# Patient Record
Sex: Male | Born: 1965
Health system: Southern US, Community
[De-identification: ages and names within clinical notes are randomized; demographics above are authoritative.]

## PROBLEM LIST (undated history)

## (undated) DIAGNOSIS — D649 Anemia, unspecified: Secondary | ICD-10-CM

## (undated) DIAGNOSIS — G4730 Sleep apnea, unspecified: Secondary | ICD-10-CM

## (undated) DIAGNOSIS — I10 Essential (primary) hypertension: Secondary | ICD-10-CM

## (undated) DIAGNOSIS — R011 Cardiac murmur, unspecified: Secondary | ICD-10-CM

## (undated) DIAGNOSIS — M1990 Unspecified osteoarthritis, unspecified site: Secondary | ICD-10-CM

## (undated) DIAGNOSIS — I509 Heart failure, unspecified: Secondary | ICD-10-CM

## (undated) HISTORY — DX: Cardiac murmur, unspecified: R01.1

## (undated) HISTORY — DX: Unspecified osteoarthritis, unspecified site: M19.90

## (undated) HISTORY — DX: Heart failure, unspecified: I50.9

---

## 2000-12-18 HISTORY — PX: CHOLECYSTECTOMY: SHX55

## 2005-01-15 ENCOUNTER — Emergency Department: Payer: Self-pay | Admitting: Emergency Medicine

## 2005-08-05 ENCOUNTER — Emergency Department: Payer: Self-pay | Admitting: Unknown Physician Specialty

## 2006-09-05 ENCOUNTER — Emergency Department (HOSPITAL_COMMUNITY): Admission: EM | Admit: 2006-09-05 | Discharge: 2006-09-05 | Payer: Self-pay | Admitting: Emergency Medicine

## 2006-10-31 ENCOUNTER — Emergency Department: Payer: Self-pay | Admitting: Emergency Medicine

## 2006-12-10 ENCOUNTER — Emergency Department: Payer: Self-pay | Admitting: Internal Medicine

## 2008-12-18 HISTORY — PX: CORNEAL TRANSPLANT: SHX108

## 2010-01-23 ENCOUNTER — Emergency Department: Payer: Self-pay | Admitting: Internal Medicine

## 2010-03-18 ENCOUNTER — Ambulatory Visit: Payer: Self-pay | Admitting: Internal Medicine

## 2010-03-25 ENCOUNTER — Inpatient Hospital Stay: Payer: Self-pay | Admitting: Internal Medicine

## 2010-04-17 ENCOUNTER — Ambulatory Visit: Payer: Self-pay | Admitting: Internal Medicine

## 2011-02-16 IMAGING — CR DG CHEST 1V PORT
1 series · 1 of 1 positions shown · non-contrast
Comparison: none

REASON FOR EXAM: vent
COMMENTS:

[view not recorded]
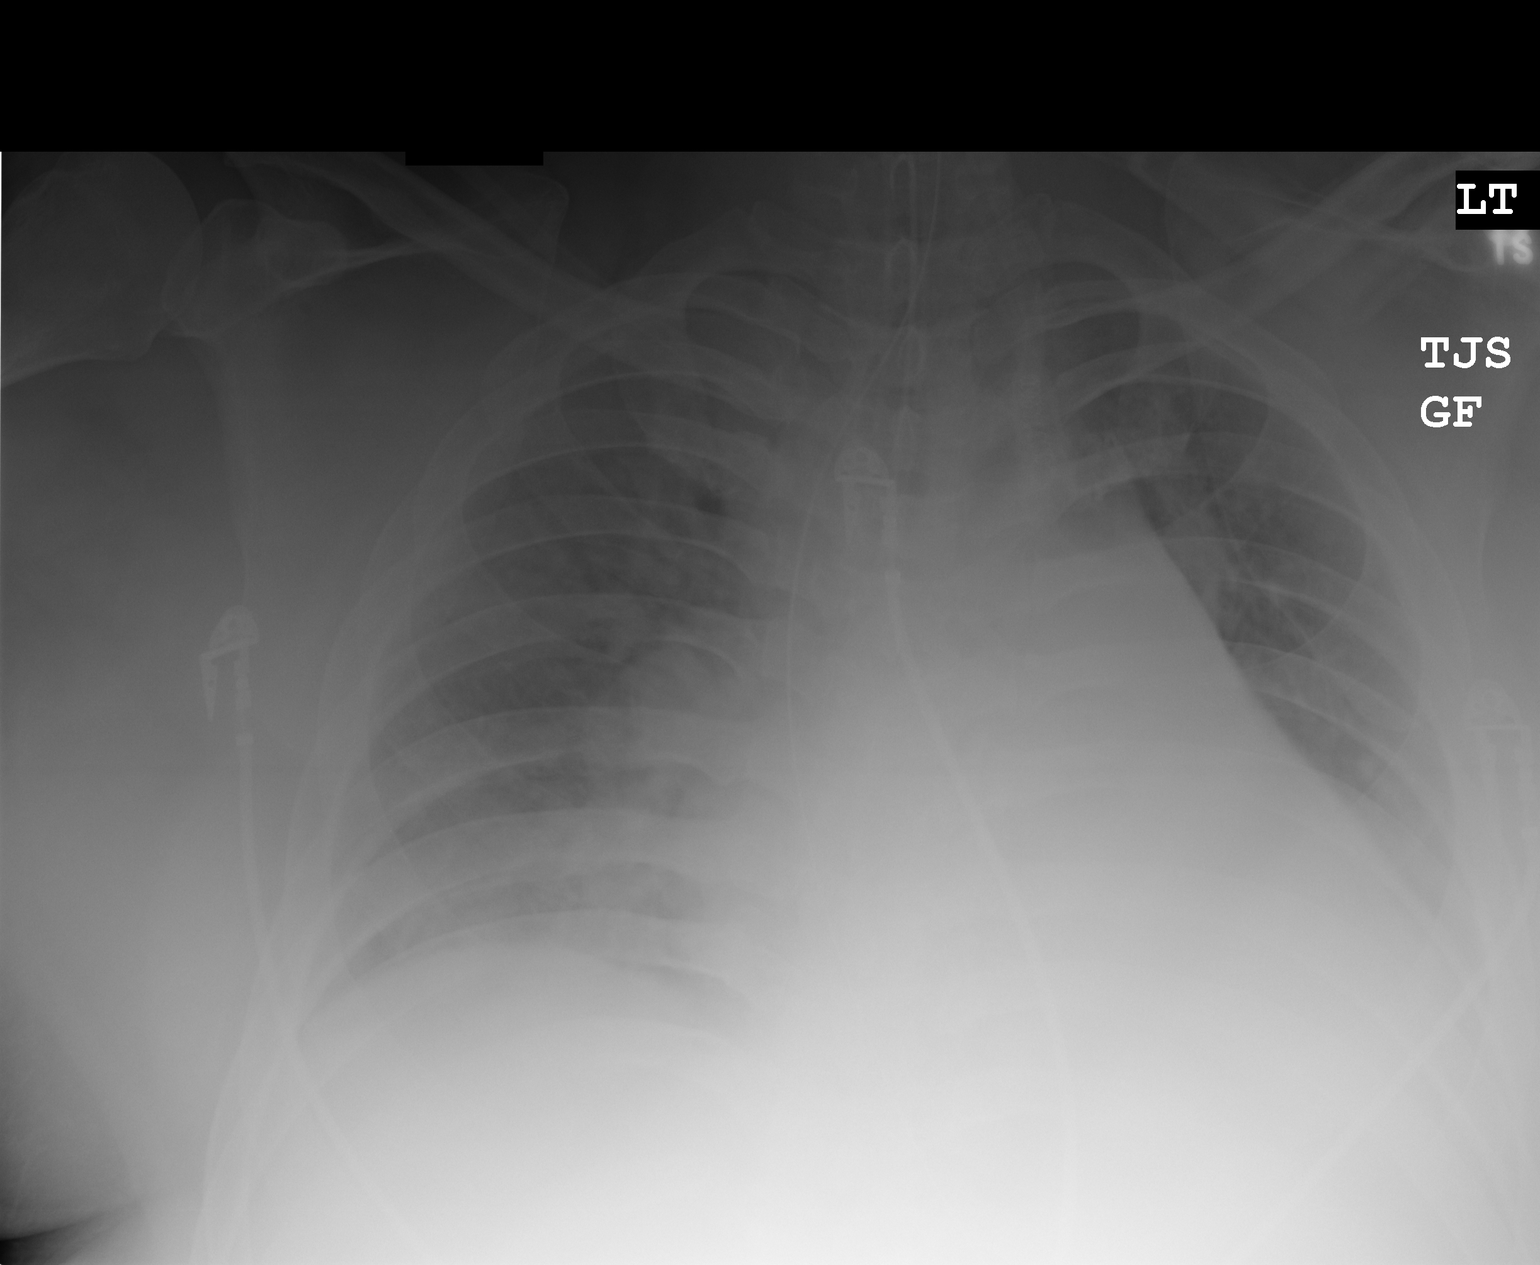

[1 of 1 positions shown; findings below may reference images not displayed]

PROCEDURE:     DXR - DXR PORTABLE CHEST SINGLE VIEW  - March 29, 2010  [DATE]

RESULT:      Comparison is made to a prior study dated 03/28/10. This study
is underexposed. With technique taken into consideration, there is
prominence of the interstitial markings. The cardiac silhouette is enlarged
indicative of cardiomegaly.  An area of consolidative density projects in
the left lung base. Endotracheal tube is appreciated with the tip projecting
approximately 1.0 cm above the carina. Retraction of approximately 1.5 cm is
recommended. NG tube is seen with the tip not on the view of this study. The
visualized bony skeleton is unremarkable.
IMPRESSION: 1. Interstitial infiltrate, edematous versus nonedematous.
2. Atelectasis versus asymmetric edema versus infiltrate left lung base.
3. Support lines and tubes as described above. Retraction of the
endotracheal tube by approximately 1.5 cm is recommended. Continued
surveillance evaluation recommended.

## 2011-05-31 ENCOUNTER — Emergency Department: Payer: Self-pay | Admitting: Emergency Medicine

## 2012-10-07 ENCOUNTER — Emergency Department: Payer: Self-pay | Admitting: Emergency Medicine

## 2012-10-07 LAB — BASIC METABOLIC PANEL
Calcium, Total: 9.1 mg/dL (ref 8.5–10.1)
EGFR (Non-African Amer.): 60 — ABNORMAL LOW
Glucose: 107 mg/dL — ABNORMAL HIGH (ref 65–99)
Potassium: 3.7 mmol/L (ref 3.5–5.1)
Sodium: 140 mmol/L (ref 136–145)

## 2012-10-07 LAB — CBC
MCH: 22.5 pg — ABNORMAL LOW (ref 26.0–34.0)
MCHC: 35.8 g/dL (ref 32.0–36.0)
MCV: 63 fL — ABNORMAL LOW (ref 80–100)
Platelet: 110 10*3/uL — ABNORMAL LOW (ref 150–440)

## 2012-10-07 LAB — TROPONIN I: Troponin-I: <0.02 ng/mL

## 2012-10-13 LAB — CULTURE, BLOOD (SINGLE)

## 2012-11-29 ENCOUNTER — Emergency Department: Payer: Self-pay | Admitting: Emergency Medicine

## 2014-04-18 ENCOUNTER — Emergency Department: Payer: Self-pay | Admitting: Emergency Medicine

## 2014-04-18 LAB — CBC
HCT: 35.6 % — ABNORMAL LOW (ref 40.0–52.0)
HGB: 12.2 g/dL — AB (ref 13.0–18.0)
MCH: 22.7 pg — ABNORMAL LOW (ref 26.0–34.0)
MCHC: 34.4 g/dL (ref 32.0–36.0)
MCV: 66 fL — ABNORMAL LOW (ref 80–100)
Platelet: 106 10*3/uL — ABNORMAL LOW (ref 150–440)
RBC: 5.38 10*6/uL (ref 4.40–5.90)
RDW: 19.9 % — AB (ref 11.5–14.5)
WBC: 9.5 10*3/uL (ref 3.8–10.6)

## 2014-04-18 LAB — TROPONIN I

## 2014-04-18 LAB — BASIC METABOLIC PANEL
Anion Gap: 9 (ref 7–16)
BUN: 20 mg/dL — ABNORMAL HIGH (ref 7–18)
CO2: 27 mmol/L (ref 21–32)
Calcium, Total: 8.7 mg/dL (ref 8.5–10.1)
Chloride: 106 mmol/L (ref 98–107)
Creatinine: 1.41 mg/dL — ABNORMAL HIGH (ref 0.60–1.30)
EGFR (Non-African Amer.): 58 — ABNORMAL LOW
Glucose: 103 mg/dL — ABNORMAL HIGH (ref 65–99)
OSMOLALITY: 286 (ref 275–301)
Potassium: 3.6 mmol/L (ref 3.5–5.1)
SODIUM: 142 mmol/L (ref 136–145)

## 2014-04-18 LAB — PRO B NATRIURETIC PEPTIDE: B-TYPE NATIURETIC PEPTID: 210 pg/mL — AB (ref 0–125)

## 2014-11-05 LAB — CBC AND DIFFERENTIAL
HEMATOCRIT: 35 % — AB (ref 41–53)
Hemoglobin: 12.3 g/dL — AB (ref 13.5–17.5)
PLATELETS: 121 10*3/uL — AB (ref 150–399)
WBC: 10.8 10^3/mL

## 2014-11-05 LAB — TSH: TSH: 2.28 u[IU]/mL (ref <=5.90)

## 2014-11-05 LAB — BASIC METABOLIC PANEL
BUN: 24 mg/dL — AB (ref 4–21)
CREATININE: 1.4 mg/dL — AB (ref <=1.3)

## 2014-11-05 LAB — LIPID PANEL
Cholesterol: 188 mg/dL (ref 0–200)
HDL: 44 mg/dL (ref 35–70)
LDL CALC: 125 mg/dL
TRIGLYCERIDES: 95 mg/dL (ref 40–160)

## 2014-11-05 LAB — PSA: PSA: 0.5

## 2014-11-05 LAB — HEPATIC FUNCTION PANEL: Bilirubin, Total: 1.7 mg/dL

## 2015-07-23 ENCOUNTER — Other Ambulatory Visit: Payer: Self-pay

## 2015-07-23 DIAGNOSIS — I10 Essential (primary) hypertension: Secondary | ICD-10-CM

## 2015-07-26 ENCOUNTER — Other Ambulatory Visit: Payer: Self-pay

## 2015-07-27 ENCOUNTER — Other Ambulatory Visit: Payer: Self-pay

## 2015-07-27 DIAGNOSIS — I10 Essential (primary) hypertension: Secondary | ICD-10-CM | POA: Insufficient documentation

## 2015-07-27 DIAGNOSIS — Z125 Encounter for screening for malignant neoplasm of prostate: Secondary | ICD-10-CM | POA: Insufficient documentation

## 2015-07-27 MED ORDER — LOSARTAN POTASSIUM 100 MG PO TABS: 100.0000 mg | ORAL_TABLET | Freq: Every day | ORAL | Status: DC

## 2015-07-27 MED ORDER — FUROSEMIDE 20 MG PO TABS: 20.0000 mg | ORAL_TABLET | Freq: Every day | ORAL | Status: DC

## 2015-07-27 MED ORDER — CARVEDILOL 25 MG PO TABS: 25.0000 mg | ORAL_TABLET | Freq: Two times a day (BID) | ORAL | Status: DC

## 2015-07-30 ENCOUNTER — Ambulatory Visit: Payer: Self-pay | Admitting: Internal Medicine

## 2015-08-04 ENCOUNTER — Ambulatory Visit: Payer: Self-pay | Admitting: Internal Medicine

## 2015-08-04 ENCOUNTER — Encounter: Payer: Self-pay | Admitting: Internal Medicine

## 2015-08-04 DIAGNOSIS — G4730 Sleep apnea, unspecified: Secondary | ICD-10-CM

## 2015-08-24 ENCOUNTER — Other Ambulatory Visit: Payer: Self-pay | Admitting: Internal Medicine

## 2015-08-24 ENCOUNTER — Ambulatory Visit: Payer: Self-pay | Admitting: Internal Medicine

## 2015-08-24 MED ORDER — LOSARTAN POTASSIUM 100 MG PO TABS: 100.0000 mg | ORAL_TABLET | Freq: Every day | ORAL | Status: DC

## 2015-08-24 MED ORDER — CARVEDILOL 25 MG PO TABS: 25.0000 mg | ORAL_TABLET | Freq: Two times a day (BID) | ORAL | Status: DC

## 2015-08-24 MED ORDER — FUROSEMIDE 20 MG PO TABS: 20.0000 mg | ORAL_TABLET | Freq: Every day | ORAL | Status: DC

## 2015-11-04 ENCOUNTER — Other Ambulatory Visit: Payer: Self-pay | Admitting: Internal Medicine

## 2015-11-04 NOTE — Telephone Encounter (Signed)
No further refills until patient seen.

## 2015-11-16 NOTE — Telephone Encounter (Signed)
Lm several messages and no return calls

## 2015-12-08 ENCOUNTER — Emergency Department
Admission: EM | Admit: 2015-12-08 | Discharge: 2015-12-09 | Disposition: A | Discharge status: Other Acute Inpatient Hospital | Payer: Self-pay | Attending: Emergency Medicine | Admitting: Emergency Medicine

## 2015-12-08 ENCOUNTER — Emergency Department: Payer: Self-pay

## 2015-12-08 ENCOUNTER — Encounter: Payer: Self-pay | Admitting: Emergency Medicine

## 2015-12-08 DIAGNOSIS — E669 Obesity, unspecified: Secondary | ICD-10-CM | POA: Insufficient documentation

## 2015-12-08 DIAGNOSIS — R400 Somnolence: Secondary | ICD-10-CM | POA: Insufficient documentation

## 2015-12-08 DIAGNOSIS — I509 Heart failure, unspecified: Secondary | ICD-10-CM | POA: Insufficient documentation

## 2015-12-08 DIAGNOSIS — I159 Secondary hypertension, unspecified: Secondary | ICD-10-CM | POA: Insufficient documentation

## 2015-12-08 DIAGNOSIS — Z79899 Other long term (current) drug therapy: Secondary | ICD-10-CM | POA: Insufficient documentation

## 2015-12-08 DIAGNOSIS — J159 Unspecified bacterial pneumonia: Secondary | ICD-10-CM | POA: Insufficient documentation

## 2015-12-08 DIAGNOSIS — K920 Hematemesis: Secondary | ICD-10-CM | POA: Insufficient documentation

## 2015-12-08 DIAGNOSIS — J189 Pneumonia, unspecified organism: Secondary | ICD-10-CM

## 2015-12-08 DIAGNOSIS — A419 Sepsis, unspecified organism: Secondary | ICD-10-CM | POA: Insufficient documentation

## 2015-12-08 DIAGNOSIS — R509 Fever, unspecified: Secondary | ICD-10-CM

## 2015-12-08 HISTORY — DX: Essential (primary) hypertension: I10

## 2015-12-08 LAB — COMPREHENSIVE METABOLIC PANEL
ALBUMIN: 4.6 g/dL (ref 3.5–5.0)
ALK PHOS: 75 U/L (ref 38–126)
ALT: 20 U/L (ref 17–63)
ANION GAP: 10 (ref 5–15)
AST: 21 U/L (ref 15–41)
BUN: 19 mg/dL (ref 6–20)
CALCIUM: 9.1 mg/dL (ref 8.9–10.3)
CO2: 28 mmol/L (ref 22–32)
CREATININE: 1.5 mg/dL — AB (ref 0.61–1.24)
Chloride: 102 mmol/L (ref 101–111)
GFR calc Af Amer: >60 mL/min (ref >60)
GFR calc non Af Amer: 52 mL/min — ABNORMAL LOW (ref >60)
GLUCOSE: 187 mg/dL — AB (ref 65–99)
Potassium: 3.9 mmol/L (ref 3.5–5.1)
SODIUM: 140 mmol/L (ref 135–145)
Total Bilirubin: 1.6 mg/dL — ABNORMAL HIGH (ref 0.3–1.2)
Total Protein: 7.6 g/dL (ref 6.5–8.1)

## 2015-12-08 LAB — CBC
HCT: 36 % — ABNORMAL LOW (ref 40.0–52.0)
HEMOGLOBIN: 12.2 g/dL — AB (ref 13.0–18.0)
MCH: 21.8 pg — AB (ref 26.0–34.0)
MCHC: 33.8 g/dL (ref 32.0–36.0)
MCV: 64.7 fL — ABNORMAL LOW (ref 80.0–100.0)
Platelets: 121 10*3/uL — ABNORMAL LOW (ref 150–440)
RBC: 5.57 MIL/uL (ref 4.40–5.90)
RDW: 22.4 % — ABNORMAL HIGH (ref 11.5–14.5)
WBC: 25 10*3/uL — ABNORMAL HIGH (ref 3.8–10.6)

## 2015-12-08 LAB — LIPASE, BLOOD: Lipase: 23 U/L (ref 11–51)

## 2015-12-08 MED ORDER — DEXTROSE 5 % IV SOLN
500.0000 mg | Freq: Once | INTRAVENOUS | Status: AC
  Administered 2015-12-09: 500 mg via INTRAVENOUS
  Filled 2015-12-08: qty 500

## 2015-12-08 MED ORDER — CEFTRIAXONE SODIUM 1 G IJ SOLR
1.0000 g | INTRAMUSCULAR | Status: AC
  Administered 2015-12-08: 1 g via INTRAVENOUS

## 2015-12-08 MED ORDER — ONDANSETRON HCL 4 MG/2ML IJ SOLN
INTRAMUSCULAR | Status: AC
  Administered 2015-12-08: 4 mg via INTRAVENOUS
  Filled 2015-12-08: qty 2

## 2015-12-08 MED ORDER — SODIUM CHLORIDE 0.9 % IV BOLUS (SEPSIS): 1000.0000 mL | INTRAVENOUS | Status: AC

## 2015-12-08 MED ORDER — PROCHLORPERAZINE MALEATE 10 MG PO TABS
10.0000 mg | ORAL_TABLET | Freq: Once | ORAL | Status: DC
  Filled 2015-12-08: qty 1

## 2015-12-08 MED ORDER — ACETAMINOPHEN 650 MG RE SUPP
RECTAL | Status: AC
  Administered 2015-12-08: 975 mg
  Filled 2015-12-08: qty 1

## 2015-12-08 MED ORDER — SODIUM CHLORIDE 0.9 % IV BOLUS (SEPSIS)
1000.0000 mL | INTRAVENOUS | Status: DC
  Administered 2015-12-09: 1000 mL via INTRAVENOUS

## 2015-12-08 MED ORDER — SODIUM CHLORIDE 0.9 % IV BOLUS (SEPSIS)
1000.0000 mL | Freq: Once | INTRAVENOUS | Status: AC
  Administered 2015-12-08: 1000 mL via INTRAVENOUS

## 2015-12-08 MED ORDER — DEXTROSE 5 % IV SOLN
1.0000 g | Freq: Once | INTRAVENOUS | Status: DC
  Filled 2015-12-08: qty 10

## 2015-12-08 NOTE — ED Notes (Addendum)
ems pt from home states started having a headache this morning no history of migraines took daughters migraine meds with no relief. ems reports elevated BP and Vomiting upon arrival to ER. pt reports pain 10/10 in head was given 4mg  zofran IV by ems PTA and 2 inches of NTG paste chest wall

## 2015-12-08 NOTE — Progress Notes (Signed)
ANTIBIOTIC CONSULT NOTE - INITIAL  Pharmacy Consult for azithromycin/ceftriaxone Indication: pneumonia  No Known Allergies  Patient Measurements: Height: 6\' 1"  (185.4 cm) Weight: (!) 352 lb (159.666 kg) IBW/kg (Calculated) : 79.9 Adjusted Body Weight:   Vital Signs: Temp: 102.4 F (39.1 C) (12/21 2324) Temp Source: Rectal (12/21 2324) BP: 183/70 mmHg (12/21 2215) Pulse Rate: 71 (12/21 2215) Intake/Output from previous day:   Intake/Output from this shift:    Labs:  Recent Labs  12/08/15 2234  WBC 25.0*  HGB 12.2*  PLT 121*  CREATININE 1.50*   Estimated Creatinine Clearance: 92.1 mL/min (by C-G formula based on Cr of 1.5). No results for input(s): VANCOTROUGH, VANCOPEAK, VANCORANDOM, GENTTROUGH, GENTPEAK, GENTRANDOM, TOBRATROUGH, TOBRAPEAK, TOBRARND, AMIKACINPEAK, AMIKACINTROU, AMIKACIN in the last 72 hours.   Microbiology: No results found for this or any previous visit (from the past 720 hour(s)).  Medical History: Past Medical History  Diagnosis Date  . Hypertension     Medications:  Infusions:  . azithromycin    . [START ON 12/09/2015] cefTRIAXone (ROCEPHIN)  IV    . sodium chloride    . sodium chloride     And  . sodium chloride     Assessment: [name], pain 10/10. WBC 25, [name], temp 102.4, [name], [name], starting azithromycin and ceftriaxone for CAP.   Goal of Therapy:    Plan:  Expected duration 7 days with resolution of temperature and/or normalization of WBC. Azithromycin 500 mg IV Q24H and ceftriaxone 2 gm IV Q24H. Pharmacy will continue to follow.  Bradley FrostNathan A Jhordan Love, Pharm.D., BCPS Clinical Pharmacist 12/08/2015,11:48 PM

## 2015-12-08 NOTE — ED Notes (Signed)
MD at bedside. 

## 2015-12-08 NOTE — ED Provider Notes (Signed)
[facility] Emergency Department Provider Note  ____________________________________________  Time seen: Approximately 11:27 PM  I have reviewed the triage vital signs and the nursing notes.   HISTORY  Chief Complaint Headache  History limited by decreased level of consciousness  HPI Bradley Love is a 49 y.o. male who presents to the ED from home via EMS with a chief complaint of cough, vomiting blood, headache, elevated blood pressure.Daughter states patient has been having a cough productive of yellow sputum for the past several days. States this evening patient began to vomit blood and complained of headache along with elevated blood pressure. Patient took a family member's  sumatriptan without relief of headache. Denies recent travel or trauma. States patient has not been feeling well today with generalized malaise. Rest of history is limited secondary to patient's decreased level of consciousness.   Past Medical History  Diagnosis Date  . Hypertension     Patient Active Problem List   Diagnosis Date Noted  . Essential (primary) hypertension 07/27/2015  . Apnea, sleep 07/27/2015  . Encounter for screening for malignant neoplasm of prostate 07/27/2015    Past Surgical History  Procedure Laterality Date  . Cholecystectomy  2002  . Corneal transplant Right 2010    Current Outpatient Rx  Name  Route  Sig  Dispense  Refill  . carvedilol (COREG) 25 MG tablet      TAKE 1 TABLET TWICE A DAY   180 tablet   0   . furosemide (LASIX) 20 MG tablet      TAKE 1 TABLET DAILY AT 2:00 P.M.   90 tablet   0   . losartan (COZAAR) 100 MG tablet      TAKE 1 TABLET DAILY AT 2:00 P.M.   90 tablet   0     Allergies Review of patient's allergies indicates no known allergies.  Family History  Problem Relation Age of Onset  . Diabetes Mother   . Diabetes Brother   . Lung cancer Sister     Social History Social History  Substance Use  Topics  . Smoking status: Never Smoker   . Smokeless tobacco: None  . Alcohol Use: 2.4 oz/week    4 Standard drinks or equivalent per week    Review of Systems Constitutional: No fever/chills Eyes: No visual changes. ENT: No sore throat. Cardiovascular: Denies chest pain. Respiratory: Positive for productive cough. Denies shortness of breath. Gastrointestinal: No abdominal pain.  Positive for vomiting blood.  No diarrhea.  No constipation. Genitourinary: Negative for dysuria. Musculoskeletal: Negative for back pain. Skin: Negative for rash. Neurological: Positive for headache. Negative for focal weakness or numbness.  10-point ROS otherwise negative.  ____________________________________________   PHYSICAL EXAM:  VITAL SIGNS: ED Triage Vitals  Enc Vitals Group     BP 12/08/15 2215 183/70 mmHg     Pulse Rate 12/08/15 2215 71     Resp 12/08/15 2215 25     Temp 12/08/15 2215 99.5 F (37.5 C)     Temp Source 12/08/15 2215 Oral     SpO2 12/08/15 2215 88 %     Weight 12/08/15 2215 352 lb (159.666 kg)     Height 12/08/15 2215  (1.854 m)     Head Cir --      Peak Flow --      Pain Score 12/08/15 2216 10     Pain Loc --      Pain Edu? --      Excl. in GC? --  Constitutional: Somnolent but arousable. Eyes: Conjunctivae are normal. PERRL. EOMI. Head: Atraumatic. Nose: No congestion/rhinnorhea. Mouth/Throat: Mucous membranes are moist.  Oropharynx non-erythematous. Neck: No stridor.  No carotid bruits. Cardiovascular: Normal rate, regular rhythm. Grossly normal heart sounds.  Good peripheral circulation. Respiratory: Increased respiratory effort.  No retractions. Lungs diminished bibasilarly; rales and rhonchi greater in left lung. Gastrointestinal: Obese. Soft and nontender. No distention. No abdominal bruits. No CVA tenderness. Musculoskeletal: No lower extremity tenderness nor edema.  No joint effusions. Neurologic:  Somnolent but arousable. Moving all  extremities 4. Skin:  Skin is hot, dry and intact. No rash noted. Specifically, no petechiae. Psychiatric: Mood and affect are normal.   ____________________________________________   LABS (all labs ordered are listed, but only abnormal results are displayed)  Labs Reviewed  COMPREHENSIVE METABOLIC PANEL - Abnormal; Notable for the following:    Glucose, Bld 187 (*)    Creatinine, Ser 1.50 (*)    Total Bilirubin 1.6 (*)    GFR calc non Af Amer 52 (*)    All other components within normal limits  CBC - Abnormal; Notable for the following:    WBC 25.0 (*)    Hemoglobin 12.2 (*)    HCT 36.0 (*)    MCV 64.7 (*)    MCH 21.8 (*)    RDW 22.4 (*)    Platelets 121 (*)    All other components within normal limits  URINALYSIS COMPLETEWITH MICROSCOPIC ([facility] ONLY) - Abnormal; Notable for the following:    Color, Urine YELLOW (*)    APPearance CLEAR (*)    Glucose, UA 50 (*)    Hgb urine dipstick 1+ (*)    Protein, ur >500 (*)    All other components within normal limits  LACTIC ACID, PLASMA - Abnormal; Notable for the following:    Lactic Acid, Venous 2.1 (*)    All other components within normal limits  BRAIN NATRIURETIC PEPTIDE - Abnormal; Notable for the following:    B Natriuretic Peptide 161.0 (*)    All other components within normal limits  BLOOD GAS, ARTERIAL - Abnormal; Notable for the following:    pH, Arterial 7.24 (*)    pCO2 arterial 64 (*)    Allens test (pass/fail) POSITIVE (*)    All other components within normal limits  PROTIME-INR - Abnormal; Notable for the following:    Prothrombin Time 16.1 (*)    All other components within normal limits  CULTURE, BLOOD (ROUTINE X 2)  CULTURE, BLOOD (ROUTINE X 2)  URINE CULTURE  LIPASE, BLOOD  TROPONIN I  LACTIC ACID, PLASMA  TYPE AND SCREEN  ABO/RH   ____________________________________________  EKG  ED ECG REPORT I, SUNG,JADE J, the attending physician, personally viewed and interpreted this ECG.   Date:  12/08/2015  EKG Time: 2235  Rate: 96  Rhythm: normal EKG, normal sinus rhythm  Axis: Normal  Intervals:none  ST&T Change: Nonspecific  ____________________________________________  RADIOLOGY  CT head without contrast interpreted per Dr. Phill MyronMcClintock: 1. No acute intracranial process. 2. Mild paranasal sinus disease as above.  Portable chest x-ray (viewed by me, interpreted per Dr. Gwenyth Benderadparvar): Diffuse left lung airspace opacity with prominence of the upper mediastinum and apparent narrowing of the trachea. Repeat radiograph with better patient positioning or CT is recommended for further evaluation. ____________________________________________   PROCEDURES  Procedure(s) performed:   INTUBATION Performed by: Irean HongSUNG,JADE J  Required items: required blood products, implants, devices, and special equipment available Patient identity confirmed: provided demographic data and hospital-assigned identification number  Time out: Immediately prior to procedure a "time out" was called to verify the correct patient, procedure, equipment, support staff and site/side marked as required.  Indications: Airway protection  Intubation method: Glidescope Laryngoscopy   Preoxygenation: BVM  Sedatives:  Etomidate Paralytic:  Succinylcholine  Tube Size: 7.5 cuffed  Post-procedure assessment: chest rise and ETCO2 monitor Breath sounds: equal and absent over the epigastrium Tube secured with: ETT holder Chest x-ray interpreted by radiologist and me.  Portable chest x-ray (viewed by me, interpreted per Dr. Grace Isaac):  1. New endotracheal tube is in good position. 2. Improved inflation but persistent asymmetric left lung opacity favoring pneumonia or aspiration. 3. Cardiopericardial enlargement which has developed since 2015. There could be pericardial effusion.  Chest x-ray findings: endotracheal tube in appropriate position  Patient tolerated the procedure well with no immediate  complications.    Critical Care performed:   CRITICAL CARE Performed by: Irean Hong   Total critical care time: 60 minutes  Critical care time was exclusive of separately billable procedures and treating other patients.  Critical care was necessary to treat or prevent imminent or life-threatening deterioration.  Critical care was time spent personally by me on the following activities: development of treatment plan with patient and/or surrogate as well as nursing, discussions with consultants, evaluation of patient's response to treatment, examination of patient, obtaining history from patient or surrogate, ordering and performing treatments and interventions, ordering and review of laboratory studies, ordering and review of radiographic studies, pulse oximetry and re-evaluation of patient's condition.  ____________________________________________   INITIAL IMPRESSION / ASSESSMENT AND PLAN / ED COURSE  Pertinent labs & imaging results that were available during my care of the patient were reviewed by me and considered in my medical decision making (see chart for details).  49 year old male with recent cold symptoms, now with productive cough, vomiting blood and lethargy. Rectal temperature is now 102.64F, WBC 25,000, elevated blood pressure. Code sepsis called immediately. Our facility is at capacity for ICU beds; patient is also patient of [facility] hospitals; will call transfer center. Updated family members of plan; they are agreeable.  ----------------------------------------- 23:46 PM on 12/09/2015 -----------------------------------------  [facility] to call back.  ----------------------------------------- 12:14 AM on 12/09/2015 -----------------------------------------  Patient decompensated. Became increasingly lethargic. Began to vomit bloody-streaked emesis. Intubated for airway protection. [facility] transfer center called back to speak with  [facility]U.  ----------------------------------------- 12:38 AM on 12/09/2015 -----------------------------------------  Spoke with Dr. Winfred Leeds [facility]U) who accepts patient in transfer. Updated family members.  ----------------------------------------- 1:05 AM on 12/09/2015 -----------------------------------------  Blood pressure elevated at 220/140; patient moving and agitated. Small propofol bolus given by nursing. Will order fentanyl drip to be sent from pharmacy to hang in addition to propofol if blood pressure remains elevated.  ----------------------------------------- 1:25 AM on 12/09/2015 -----------------------------------------  Blood pressure 179/76 after small propofol bolus. Bed assigned at [facility]; transport called. Family updated and aware of plan. ____________________________________________   FINAL CLINICAL IMPRESSION(S) / ED DIAGNOSES  Final diagnoses:  Sepsis, due to unspecified organism (HCC)  CAP (community acquired pneumonia)  Secondary hypertension, unspecified  Acute on chronic congestive heart failure, unspecified congestive heart failure type (HCC)  Fever, unspecified fever cause  Hematemesis, presence of nausea not specified      Irean Hong, MD 12/09/15 [phone]

## 2015-12-09 ENCOUNTER — Emergency Department: Payer: Self-pay

## 2015-12-09 ENCOUNTER — Ambulatory Visit (HOSPITAL_COMMUNITY)
Admission: AD | Admit: 2015-12-09 | Discharge: 2015-12-09 | Disposition: A | Discharge status: Home / Self Care | Payer: Self-pay | Source: Other Acute Inpatient Hospital | Attending: Emergency Medicine | Admitting: Emergency Medicine

## 2015-12-09 DIAGNOSIS — A419 Sepsis, unspecified organism: Secondary | ICD-10-CM | POA: Insufficient documentation

## 2015-12-09 DIAGNOSIS — G939 Disorder of brain, unspecified: Secondary | ICD-10-CM | POA: Insufficient documentation

## 2015-12-09 DIAGNOSIS — J9600 Acute respiratory failure, unspecified whether with hypoxia or hypercapnia: Secondary | ICD-10-CM | POA: Insufficient documentation

## 2015-12-09 DIAGNOSIS — J189 Pneumonia, unspecified organism: Secondary | ICD-10-CM | POA: Insufficient documentation

## 2015-12-09 LAB — TYPE AND SCREEN
ABO/RH(D): A POS
Antibody Screen: NEGATIVE

## 2015-12-09 LAB — BLOOD GAS, ARTERIAL
Acid-base deficit: 1.4 mmol/L (ref 0.0–2.0)
Allens test (pass/fail): POSITIVE — AB
BICARBONATE: 27.4 meq/L (ref 21.0–28.0)
FIO2: 1
LHR: 20 {breaths}/min
O2 Saturation: 96.5 %
PATIENT TEMPERATURE: 37
PEEP/CPAP: 5 cmH2O
PO2 ART: 100 mmHg (ref 83.0–108.0)
VT: 500 mL
pCO2 arterial: 64 mmHg — ABNORMAL HIGH (ref 32.0–48.0)
pH, Arterial: 7.24 — ABNORMAL LOW (ref 7.350–7.450)

## 2015-12-09 LAB — URINALYSIS COMPLETE WITH MICROSCOPIC (ARMC ONLY)
BILIRUBIN URINE: NEGATIVE
Bacteria, UA: NONE SEEN
GLUCOSE, UA: 50 mg/dL — AB
KETONES UR: NEGATIVE mg/dL
Leukocytes, UA: NEGATIVE
NITRITE: NEGATIVE
Protein, ur: >500 mg/dL — AB
SPECIFIC GRAVITY, URINE: 1.014 (ref 1.005–1.030)
Squamous Epithelial / LPF: NONE SEEN
pH: 6 (ref 5.0–8.0)

## 2015-12-09 LAB — BLOOD CULTURE ID PANEL (REFLEXED)
Acinetobacter baumannii: NOT DETECTED
CANDIDA ALBICANS: NOT DETECTED
CANDIDA GLABRATA: NOT DETECTED
CANDIDA TROPICALIS: NOT DETECTED
Candida krusei: NOT DETECTED
Candida parapsilosis: NOT DETECTED
Carbapenem resistance: NOT DETECTED
ENTEROBACTER CLOACAE COMPLEX: NOT DETECTED
ENTEROBACTERIACEAE SPECIES: NOT DETECTED
Enterococcus species: NOT DETECTED
Escherichia coli: NOT DETECTED
Haemophilus influenzae: NOT DETECTED
KLEBSIELLA OXYTOCA: NOT DETECTED
KLEBSIELLA PNEUMONIAE: NOT DETECTED
Listeria monocytogenes: NOT DETECTED
Methicillin resistance: NOT DETECTED
NEISSERIA MENINGITIDIS: NOT DETECTED
Proteus species: NOT DETECTED
Pseudomonas aeruginosa: NOT DETECTED
STAPHYLOCOCCUS SPECIES: NOT DETECTED
STREPTOCOCCUS AGALACTIAE: NOT DETECTED
STREPTOCOCCUS PNEUMONIAE: DETECTED — AB
Serratia marcescens: NOT DETECTED
Staphylococcus aureus (BCID): NOT DETECTED
Streptococcus pyogenes: NOT DETECTED
Streptococcus species: NOT DETECTED
Vancomycin resistance: NOT DETECTED

## 2015-12-09 LAB — LACTIC ACID, PLASMA: LACTIC ACID, VENOUS: 2.1 mmol/L — AB (ref 0.5–2.0)

## 2015-12-09 LAB — PROTIME-INR
INR: 1.28
PROTHROMBIN TIME: 16.1 s — AB (ref 11.4–15.0)

## 2015-12-09 LAB — BRAIN NATRIURETIC PEPTIDE: B Natriuretic Peptide: 161 pg/mL — ABNORMAL HIGH (ref 0.0–100.0)

## 2015-12-09 LAB — TROPONIN I

## 2015-12-09 LAB — ABO/RH: ABO/RH(D): A POS

## 2015-12-09 MED ORDER — ONDANSETRON HCL 4 MG/2ML IJ SOLN
4.0000 mg | Freq: Once | INTRAMUSCULAR | Status: AC
  Administered 2015-12-08: 4 mg via INTRAVENOUS

## 2015-12-09 MED ORDER — ETOMIDATE 2 MG/ML IV SOLN
INTRAVENOUS | Status: DC | PRN
  Administered 2015-12-09: 20 mg via INTRAVENOUS

## 2015-12-09 MED ORDER — PROPOFOL 1000 MG/100ML IV EMUL
INTRAVENOUS | Status: DC | PRN
  Administered 2015-12-09: 19.2 ug/kg/min via INTRAVENOUS
  Administered 2015-12-09: 20 mg via INTRAVENOUS
  Administered 2015-12-09: 1000 mg via INTRAVENOUS

## 2015-12-09 MED ORDER — NALOXONE HCL 0.4 MG/ML IJ SOLN: 0.4000 mg | INTRAMUSCULAR | Status: DC | PRN

## 2015-12-09 MED ORDER — PROPOFOL 1000 MG/100ML IV EMUL
INTRAVENOUS | Status: AC
  Administered 2015-12-09: 1000 mg via INTRAVENOUS
  Filled 2015-12-09: qty 100

## 2015-12-09 MED ORDER — ONDANSETRON HCL 4 MG/2ML IJ SOLN: 4.0000 mg | Freq: Four times a day (QID) | INTRAMUSCULAR | Status: DC | PRN

## 2015-12-09 MED ORDER — VECURONIUM BROMIDE 10 MG IV SOLR
INTRAVENOUS | Status: DC | PRN
  Administered 2015-12-09: 10 mg via INTRAVENOUS

## 2015-12-09 MED ORDER — DIPHENHYDRAMINE HCL 50 MG/ML IJ SOLN: 12.5000 mg | Freq: Four times a day (QID) | INTRAMUSCULAR | Status: DC | PRN

## 2015-12-09 MED ORDER — PROPOFOL 1000 MG/100ML IV EMUL
INTRAVENOUS | Status: AC
  Filled 2015-12-09: qty 100

## 2015-12-09 MED ORDER — FENTANYL 40 MCG/ML IV SOLN: INTRAVENOUS | Status: DC

## 2015-12-09 MED ORDER — FENTANYL 2500MCG IN NS 250ML (10MCG/ML) PREMIX INFUSION
50.0000 ug/h | INTRAVENOUS | Status: DC
  Administered 2015-12-09: 50 ug/h via INTRAVENOUS

## 2015-12-09 MED ORDER — SODIUM CHLORIDE 0.9 % IJ SOLN: 9.0000 mL | INTRAMUSCULAR | Status: DC | PRN

## 2015-12-09 MED ORDER — FENTANYL 2500MCG IN NS 250ML (10MCG/ML) PREMIX INFUSION
INTRAVENOUS | Status: AC
  Administered 2015-12-09: 50 ug/h via INTRAVENOUS
  Filled 2015-12-09: qty 250

## 2015-12-09 MED ORDER — SUCCINYLCHOLINE CHLORIDE 20 MG/ML IJ SOLN
INTRAMUSCULAR | Status: DC | PRN
  Administered 2015-12-09: 200 mg via INTRAVENOUS

## 2015-12-09 MED ORDER — MIDAZOLAM HCL 2 MG/2ML IJ SOLN
INTRAMUSCULAR | Status: AC
  Administered 2015-12-09: 2 mg
  Filled 2015-12-09: qty 2

## 2015-12-09 MED ORDER — DIPHENHYDRAMINE HCL 12.5 MG/5ML PO ELIX: 12.5000 mg | ORAL_SOLUTION | Freq: Four times a day (QID) | ORAL | Status: DC | PRN

## 2015-12-11 LAB — CULTURE, BLOOD (ROUTINE X 2)

## 2015-12-11 LAB — URINE CULTURE: CULTURE: NO GROWTH

## 2015-12-15 ENCOUNTER — Encounter: Payer: Self-pay | Admitting: Internal Medicine

## 2015-12-23 ENCOUNTER — Encounter: Payer: Self-pay | Admitting: Internal Medicine

## 2015-12-23 ENCOUNTER — Telehealth: Payer: Self-pay

## 2015-12-23 NOTE — Telephone Encounter (Signed)
My last encounter with Mr. Sampson GoonFitzgerald was 12/04/14.  I have no information or knowledge regarding his current status or treatment.  I defer any orders to the ordering physician.

## 2015-12-23 NOTE — Telephone Encounter (Signed)
Holly with pharmacy services called about patient today. She states that currently he is receiving IV antibiotics and is scheduled to finish antibiotics on Saturday. She wanted to get an order to remove his PIC line with home health once he has completed antibiotics. Holly's direct number [phone]. Thanks.

## 2015-12-24 NOTE — Telephone Encounter (Signed)
Spoke with GonzalesHolly. Orders deferred to ordering physicans. Verbalized understanding. [facility]

## 2015-12-28 ENCOUNTER — Other Ambulatory Visit: Payer: Self-pay | Admitting: Internal Medicine

## 2015-12-29 ENCOUNTER — Encounter: Payer: Self-pay | Admitting: Internal Medicine

## 2015-12-29 ENCOUNTER — Ambulatory Visit (INDEPENDENT_AMBULATORY_CARE_PROVIDER_SITE_OTHER): Payer: Self-pay | Admitting: Internal Medicine

## 2015-12-29 VITALS — BP 128/88 | HR 73 | Temp 98.3°F | Ht 73.0 in | Wt 334.0 lb

## 2015-12-29 DIAGNOSIS — G939 Disorder of brain, unspecified: Secondary | ICD-10-CM

## 2015-12-29 DIAGNOSIS — J189 Pneumonia, unspecified organism: Secondary | ICD-10-CM

## 2015-12-29 DIAGNOSIS — J9600 Acute respiratory failure, unspecified whether with hypoxia or hypercapnia: Secondary | ICD-10-CM

## 2015-12-29 DIAGNOSIS — I10 Essential (primary) hypertension: Secondary | ICD-10-CM

## 2015-12-29 DIAGNOSIS — A419 Sepsis, unspecified organism: Secondary | ICD-10-CM

## 2015-12-29 DIAGNOSIS — B89 Unspecified parasitic disease: Secondary | ICD-10-CM

## 2015-12-29 NOTE — Progress Notes (Signed)
Date:  12/29/2015   Name:  ISAEL STILLE   DOB:  11/05/1966   MRN:  [id number]  Patient was admitted from 12/09/2015 to 12/17/2015 at [facility] for hypoxemia, pneumonia strep pneumo and possible meningitis. He recently completed a course of IV antibiotics - PICC line was removed yesterday. He feels well and is ready to return to work.  Chief Complaint: Hospitalization Follow-up; Blood Infection; and Hypertension Hypertension This is a chronic problem. The current episode started more than 1 year ago. The problem has been waxing and waning since onset. The problem is controlled (improved since being home). Pertinent negatives include no chest pain, headaches, palpitations or shortness of breath. Past treatments include angiotensin blockers, beta blockers and diuretics. The current treatment provides significant improvement. There are no compliance problems.   HCTZ and Procardia were added at hospital discharge. He did not pick these prescriptions up because they were $300 without insurance.  He will be getting insurance coverage within the next week or 2.     Review of Systems  Constitutional: Negative for fever, chills and fatigue.  Eyes: Negative for visual disturbance.  Respiratory: Negative for chest tightness and shortness of breath.   Cardiovascular: Negative for chest pain and palpitations.  Gastrointestinal: Negative for nausea, abdominal pain and diarrhea.  Musculoskeletal: Negative for arthralgias.  Neurological: Negative for seizures, weakness, light-headedness and headaches.  Hematological: Negative for adenopathy.  Psychiatric/Behavioral: Negative for dysphoric mood.    Patient Active Problem List   Diagnosis Date Noted  . Acute respiratory failure (HCC) 12/09/2015  . Brain disorder 12/09/2015  . Pneumonia due to infectious agent 12/09/2015  . Systemic infection (HCC) 12/09/2015  . Essential (primary) hypertension 07/27/2015  . Apnea, sleep 07/27/2015  .  Encounter for screening for malignant neoplasm of prostate 07/27/2015    Prior to Admission medications   Medication Sig Start Date End Date Taking? Authorizing Provider  albuterol (PROAIR HFA) 108 (90 Base) MCG/ACT inhaler Inhale 2 puffs into the lungs 4 (four) times daily as needed. 12/17/15 12/16/16 Yes Historical Provider, MD  aspirin 81 MG tablet Take 1 tablet by mouth daily. 07/25/10  Yes Historical Provider, MD  carvedilol (COREG) 25 MG tablet TAKE 1 TABLET TWICE A DAY 11/04/15  Yes Reubin Milan, MD  desloratadine (CLARINEX) 5 MG tablet Take 5 mg by mouth daily. 01/21/15 01/21/16 Yes Historical Provider, MD  Ferrous Sulfate (IRON) 325 (65 Fe) MG TABS Take 2 tablets by mouth daily. 08/08/10  Yes Historical Provider, MD  furosemide (LASIX) 20 MG tablet TAKE 1 TABLET DAILY AT 2:00 P.M. 11/04/15  Yes Reubin Milan, MD  losartan (COZAAR) 100 MG tablet TAKE 1 TABLET DAILY AT 2:00 P.M. 11/04/15  Yes Reubin Milan, MD  neomycin-polymyxin b-dexamethasone (MAXITROL) 3.5-10000-0.1 OINT Administer 1 application into the left eye every twelve (12) hours as needed (left eye irritation). 12/17/15  Yes Historical Provider, MD  Omega 3 1200 MG CAPS Take 1 capsule by mouth daily. 08/08/10  Yes Historical Provider, MD  prednisoLONE acetate (PRED FORTE) 1 % ophthalmic suspension Administer 1 drop into the left eye daily. for 14 days 12/17/15 12/31/15 Yes Historical Provider, MD  hydrochlorothiazide (HYDRODIURIL) 25 MG tablet Take 25 mg by mouth daily. Reported on 12/29/2015 12/17/15 01/16/16  Historical Provider, MD  NIFEdipine (PROCARDIA XL/ADALAT-CC) 60 MG 24 hr tablet Take 60 mg by mouth daily. Reported on 12/29/2015 12/17/15 12/16/16  Historical Provider, MD  potassium chloride (MICRO-K) 10 MEQ CR capsule Take 1 capsule by mouth daily.  Reported on 12/29/2015 07/25/10   Historical Provider, MD    No Known Allergies  Past Surgical History  Procedure Laterality Date  . Cholecystectomy  2002  . Corneal  transplant Right 2010    Social History  Substance Use Topics  . Smoking status: Never Smoker   . Smokeless tobacco: None  . Alcohol Use: 2.4 oz/week    4 Standard drinks or equivalent per week     Comment: occasionally     Medication list has been reviewed and updated.   Physical Exam  Constitutional: He is oriented to person, place, and time. He appears well-developed and well-nourished.  Eyes: EOM are normal.  Neck: Normal range of motion. No thyromegaly present.  Cardiovascular: Normal rate, regular rhythm and normal heart sounds.   Pulmonary/Chest: Effort normal and breath sounds normal. He has no wheezes.  Musculoskeletal: He exhibits no edema or tenderness.  Lymphadenopathy:    He has no cervical adenopathy.  Neurological: He is alert and oriented to person, place, and time. He has normal strength. Coordination and gait normal.  Skin: Skin is warm and dry.     Nursing note and vitals reviewed.   BP 128/88 mmHg  Pulse 73  Temp(Src) 98.3 F (36.8 C) (Oral)  Ht 6\' 1"  (1.854 m)  Wt 334 lb (151.501 kg)  BMI 44.08 kg/m2  SpO2 98%  Assessment and Plan: 1. Systemic infection (HCC) Completed 14 day IV ceftriaxone Doing well - note given to return to work tomorrow  2. Brain disorder Resolved after prolonged antibiotic treatment  3. Acute respiratory failure, unspecified whether with hypoxia or hypercapnia (HCC) Required intubation while hospitalized No residual symptoms at present  4. Pneumonia due to infectious agent Will need Pneumovax 23 and Prevnar 13 in the future  5. Essential (primary) hypertension Continue losartan, Lasix, and carvedilol Will reassess next visit and add medication if needed   Bari EdwardLaura Norton Bivins, MD [facility]  12/29/2015

## 2016-01-06 ENCOUNTER — Other Ambulatory Visit: Payer: Self-pay | Admitting: Internal Medicine

## 2016-02-23 ENCOUNTER — Other Ambulatory Visit: Payer: Self-pay

## 2016-02-23 MED ORDER — LOSARTAN POTASSIUM 100 MG PO TABS: 100.0000 mg | ORAL_TABLET | Freq: Every day | ORAL | Status: DC

## 2016-02-23 MED ORDER — FUROSEMIDE 20 MG PO TABS: 20.0000 mg | ORAL_TABLET | Freq: Every day | ORAL | Status: DC

## 2016-02-23 MED ORDER — CARVEDILOL 25 MG PO TABS: 25.0000 mg | ORAL_TABLET | Freq: Two times a day (BID) | ORAL | Status: DC

## 2016-03-28 ENCOUNTER — Other Ambulatory Visit: Payer: Self-pay | Admitting: Internal Medicine

## 2016-03-28 ENCOUNTER — Ambulatory Visit: Payer: Self-pay | Admitting: Internal Medicine

## 2016-09-11 ENCOUNTER — Ambulatory Visit: Payer: Self-pay | Admitting: Internal Medicine

## 2016-09-13 ENCOUNTER — Encounter: Payer: Self-pay | Admitting: Internal Medicine

## 2016-09-13 ENCOUNTER — Ambulatory Visit (INDEPENDENT_AMBULATORY_CARE_PROVIDER_SITE_OTHER): Payer: 59 | Admitting: Internal Medicine

## 2016-09-13 VITALS — BP 162/100 | HR 78 | Temp 98.4°F | Resp 16 | Ht 72.0 in | Wt 332.0 lb

## 2016-09-13 DIAGNOSIS — I10 Essential (primary) hypertension: Secondary | ICD-10-CM

## 2016-09-13 DIAGNOSIS — D649 Anemia, unspecified: Secondary | ICD-10-CM | POA: Diagnosis not present

## 2016-09-13 DIAGNOSIS — Z23 Encounter for immunization: Secondary | ICD-10-CM

## 2016-09-13 DIAGNOSIS — E669 Obesity, unspecified: Secondary | ICD-10-CM

## 2016-09-13 DIAGNOSIS — Z6841 Body Mass Index (BMI) 40.0 and over, adult: Secondary | ICD-10-CM

## 2016-09-13 MED ORDER — CARVEDILOL 25 MG PO TABS: 25.0000 mg | ORAL_TABLET | Freq: Two times a day (BID) | ORAL | 1 refills | Status: DC

## 2016-09-13 MED ORDER — LOSARTAN POTASSIUM 100 MG PO TABS: 100.0000 mg | ORAL_TABLET | Freq: Every day | ORAL | 1 refills | Status: DC

## 2016-09-13 MED ORDER — AMLODIPINE BESYLATE 10 MG PO TABS: 10.0000 mg | ORAL_TABLET | Freq: Every day | ORAL | 1 refills | Status: DC

## 2016-09-13 MED ORDER — FUROSEMIDE 20 MG PO TABS: 20.0000 mg | ORAL_TABLET | Freq: Every day | ORAL | 1 refills | Status: DC

## 2016-09-13 NOTE — Progress Notes (Signed)
Date:  09/13/2016   Name:  Gerda DissJames K Filippone   DOB:  02/15/1966   MRN:  [id number]  Seen in ER at [facility] 2 days ago with sore throat.  Dx'd as viral and prescribed Tessalon perles.  BP was very high and he was told to follow up. BP 216/122.  Chief Complaint: Hypertension (ER two days ago high BP 222/116) Hypertension  This is a chronic problem. The current episode started more than 1 year ago. The problem is uncontrolled. Pertinent negatives include no chest pain, headaches, palpitations or shortness of breath. Past treatments include angiotensin blockers, beta blockers and diuretics. Compliance problems include medication cost.   He was last seen in January with relatively normal BP and asked to continue triple therapy and return for re-evaluation in 3 months.   [facility].  He was instructed to take iron daily and have CBC rechecked.  He continues on iron daily.  Wt Readings from Last 3 Encounters:  09/13/16 (!) 332 lb (150.6 kg)  12/29/15 (!) 334 lb (151.5 kg)  12/08/15 (!) 352 lb (159.7 kg)    Review of Systems  Constitutional: Negative for chills, fatigue and fever.  Respiratory: Negative for cough, chest tightness and shortness of breath.   Cardiovascular: Negative for chest pain and palpitations.  Neurological: Negative for dizziness and headaches.    Patient Active Problem List   Diagnosis Date Noted  . Essential (primary) hypertension 07/27/2015  . Apnea, sleep 07/27/2015  . Encounter for screening for malignant neoplasm of prostate 07/27/2015    Prior to Admission medications   Medication Sig Start Date End Date Taking? Authorizing Provider  benzonatate (TESSALON) 100 MG capsule Take 100 mg by mouth. 09/11/16 09/18/16 Yes Historical Provider, MD  albuterol (PROAIR HFA) 108 (90 Base) MCG/ACT inhaler Inhale 2 puffs into the lungs 4 (four) times daily as needed. 12/17/15 12/16/16  Historical Provider, MD  aspirin 81 MG tablet Take 1 tablet by mouth  daily. 07/25/10   Historical Provider, MD  carvedilol (COREG) 25 MG tablet Take 1 tablet (25 mg total) by mouth 2 (two) times daily. 02/23/16   Reubin MilanLaura H Marla Pouliot, MD  desloratadine (CLARINEX) 5 MG tablet Take 5 mg by mouth daily. 01/21/15 01/21/16  Historical Provider, MD  Ferrous Sulfate (IRON) 325 (65 Fe) MG TABS Take 2 tablets by mouth daily. 08/08/10   Historical Provider, MD  fluticasone Aleda Grana(FLONASE) 50 MCG/ACT nasal spray  01/21/15   Historical Provider, MD  furosemide (LASIX) 20 MG tablet Take 1 tablet (20 mg total) by mouth daily. 02/23/16   Reubin MilanLaura H Rubie Ficco, MD  losartan (COZAAR) 100 MG tablet Take 1 tablet (100 mg total) by mouth daily. 02/23/16   Reubin MilanLaura H Aycen Porreca, MD  Omega 3 1200 MG CAPS Take 1 capsule by mouth daily. 08/08/10   Historical Provider, MD  potassium chloride (MICRO-K) 10 MEQ CR capsule Take 1 capsule by mouth daily. Reported on 12/29/2015 07/25/10   Historical Provider, MD    No Known Allergies  Past Surgical History:  Procedure Laterality Date  . CHOLECYSTECTOMY  2002  . CORNEAL TRANSPLANT Right 2010    Social History  Substance Use Topics  . Smoking status: Never Smoker  . Smokeless tobacco: Never Used  . Alcohol use 2.4 oz/week    4 Standard drinks or equivalent per week     Comment: occasionally     Medication list has been reviewed and updated.   Physical Exam  Constitutional: He is oriented to  person, place, and time. He appears well-developed. No distress.  HENT:  Head: Normocephalic and atraumatic.  Cardiovascular: Normal rate, regular rhythm and normal heart sounds.   Pulmonary/Chest: Effort normal and breath sounds normal. No respiratory distress.  Musculoskeletal: Normal range of motion.  Neurological: He is alert and oriented to person, place, and time.  Skin: Skin is warm and dry. No rash noted.  Psychiatric: He has a normal mood and affect. His speech is normal and behavior is normal. Thought content normal.  Nursing note and vitals reviewed.   BP (!)  162/100   Pulse 78   Temp 98.4 F (36.9 C) (Oral)   Resp 16   Ht 6' (1.829 m)   Wt (!) 332 lb (150.6 kg)   SpO2 97%   BMI 45.03 kg/m   Assessment and Plan: 1. Essential (primary) hypertension Add amlodipine - amLODipine (NORVASC) 10 MG tablet; Take 1 tablet (10 mg total) by mouth daily.  Dispense: 90 tablet; Refill: 1 - carvedilol (COREG) 25 MG tablet; Take 1 tablet (25 mg total) by mouth 2 (two) times daily.  Dispense: 180 tablet; Refill: 1 - furosemide (LASIX) 20 MG tablet; Take 1 tablet (20 mg total) by mouth daily.  Dispense: 90 tablet; Refill: 1 - losartan (COZAAR) 100 MG tablet; Take 1 tablet (100 mg total) by mouth daily.  Dispense: 90 tablet; Refill: 1 - Comprehensive metabolic panel  2. Anemia, unspecified anemia type Check labs - may be able to stop Iron - CBC with Differential/Platelet  3. Need for influenza vaccination - Flu Vaccine QUAD 36+ mos IM  4. Obesity Patient considering Gastric Sleeve - recommend he attend a seminar to learn more and investigate insurance coverage   Bari Edward, MD [facility]  09/13/2016

## 2016-09-14 LAB — COMPREHENSIVE METABOLIC PANEL
ALT: 25 IU/L (ref 0–44)
AST: 21 IU/L (ref 0–40)
Albumin/Globulin Ratio: 1.7 (ref 1.2–2.2)
Albumin: 4.4 g/dL (ref 3.5–5.5)
Alkaline Phosphatase: 98 IU/L (ref 39–117)
BUN/Creatinine Ratio: 15 (ref 9–20)
BUN: 23 mg/dL (ref 6–24)
Bilirubin Total: 1.2 mg/dL (ref 0.0–1.2)
CO2: 23 mmol/L (ref 18–29)
Calcium: 9.1 mg/dL (ref 8.7–10.2)
Chloride: 103 mmol/L (ref 96–106)
Creatinine, Ser: 1.54 mg/dL — ABNORMAL HIGH (ref 0.76–1.27)
GFR calc Af Amer: 60 mL/min/{1.73_m2} (ref >59)
GFR calc non Af Amer: 51 mL/min/{1.73_m2} — ABNORMAL LOW (ref >59)
Globulin, Total: 2.6 g/dL (ref 1.5–4.5)
Glucose: 97 mg/dL (ref 65–99)
Potassium: 4.1 mmol/L (ref 3.5–5.2)
Sodium: 143 mmol/L (ref 134–144)
Total Protein: 7 g/dL (ref 6.0–8.5)

## 2016-09-14 LAB — CBC WITH DIFFERENTIAL/PLATELET
BASOS: 0 %
Basophils Absolute: 0 10*3/uL (ref 0.0–0.2)
EOS (ABSOLUTE): 0.2 10*3/uL (ref 0.0–0.4)
EOS: 2 %
HEMATOCRIT: 32.5 % — AB (ref 37.5–51.0)
HEMOGLOBIN: 11.7 g/dL — AB (ref 12.6–17.7)
IMMATURE GRANS (ABS): 0.1 10*3/uL (ref 0.0–0.1)
IMMATURE GRANULOCYTES: 1 %
LYMPHS: 20 %
Lymphocytes Absolute: 2.4 10*3/uL (ref 0.7–3.1)
MCH: 22.8 pg — ABNORMAL LOW (ref 26.6–33.0)
MCHC: 36 g/dL — ABNORMAL HIGH (ref 31.5–35.7)
MCV: 63 fL — ABNORMAL LOW (ref 79–97)
Monocytes Absolute: 0.6 10*3/uL (ref 0.1–0.9)
Monocytes: 5 %
NEUTROS PCT: 72 %
Neutrophils Absolute: 8.6 10*3/uL — ABNORMAL HIGH (ref 1.4–7.0)
Platelets: 141 10*3/uL — ABNORMAL LOW (ref 150–379)
RBC: 5.14 x10E6/uL (ref 4.14–5.80)
RDW: 19.6 % — ABNORMAL HIGH (ref 12.3–15.4)
WBC: 12 10*3/uL — ABNORMAL HIGH (ref 3.4–10.8)

## 2016-09-19 ENCOUNTER — Telehealth: Payer: Self-pay

## 2016-09-19 NOTE — Telephone Encounter (Signed)
All his medications are generic.

## 2016-09-19 NOTE — Telephone Encounter (Signed)
Patient wants Generic Rx for last Rx ordered,

## 2016-09-19 NOTE — Telephone Encounter (Signed)
LMTCB

## 2016-10-11 ENCOUNTER — Telehealth: Payer: Self-pay | Admitting: Internal Medicine

## 2016-10-11 NOTE — Telephone Encounter (Signed)
Patient was calling in regards to getting his blood work results. Please advise

## 2016-10-12 NOTE — Telephone Encounter (Signed)
LMTCB

## 2016-10-27 IMAGING — CT CT HEAD W/O CM
2 series · 16 of 30 positions shown, 20 images · non-contrast
Comparison: Prior study from 05/31/2011.

CLINICAL DATA: Initial evaluation for acute headache.

EXAM:
CT HEAD WITHOUT CONTRAST
TECHNIQUE: Contiguous axial images were obtained from the base of the skull
through the vertex without intravenous contrast.

[Series 2: head wo · axial · 0.43mm/px · z∈[-194,-64]mm · 13 of 32 slices shown, 17 images]
[im 3/32  brain]
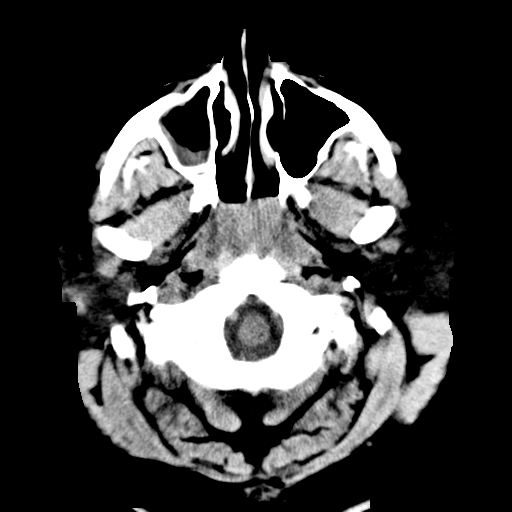
[im 3/32  bone]
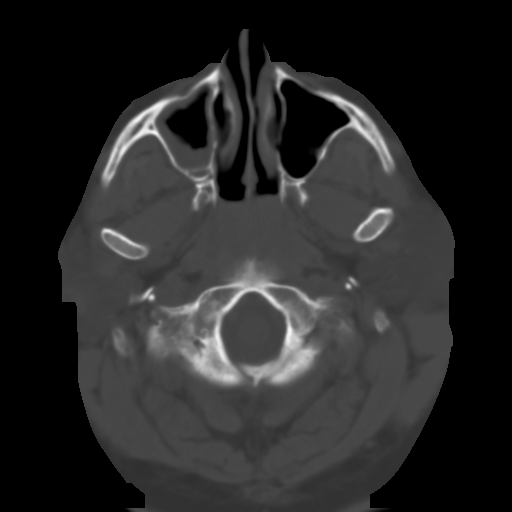
[im 5/32  brain]
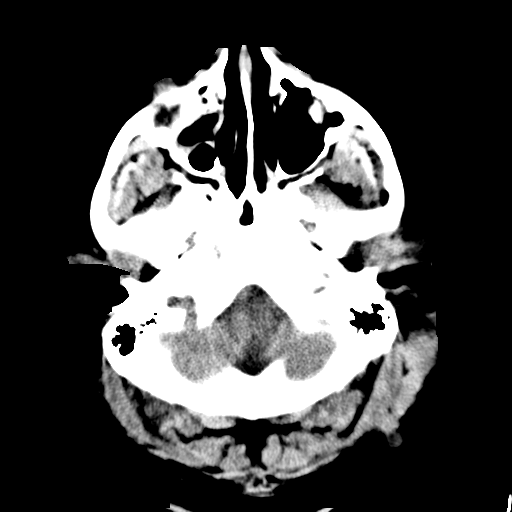
[im 7/32  brain]
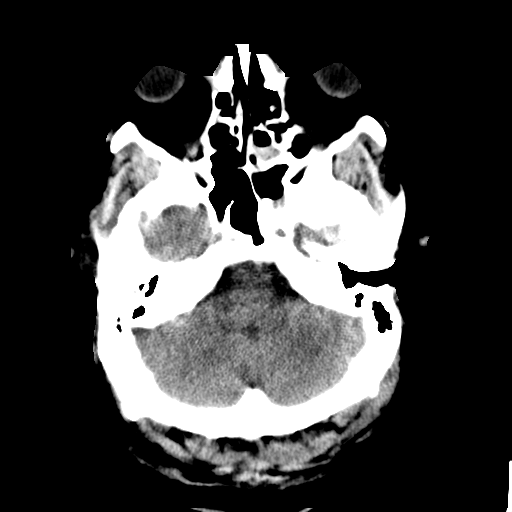
[im 9/32  brain]
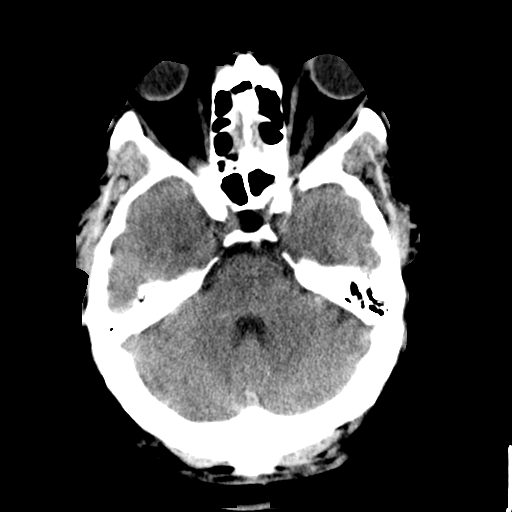
[im 12/32  brain]
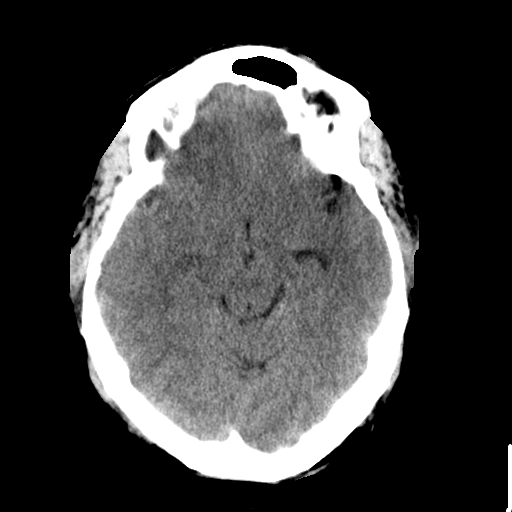
[im 12/32  bone]
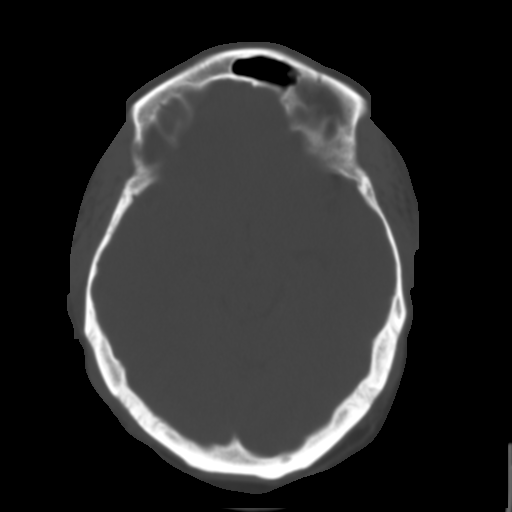
[im 14/32  brain]
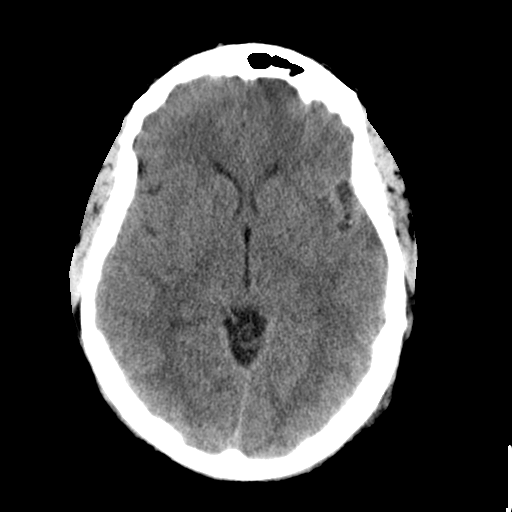
[im 16/32  brain]
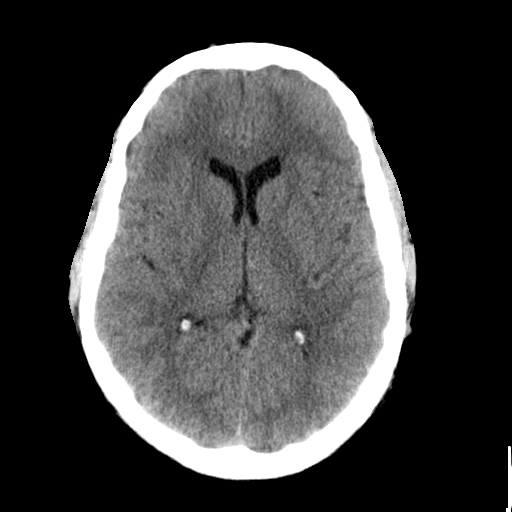
[im 18/32  brain]
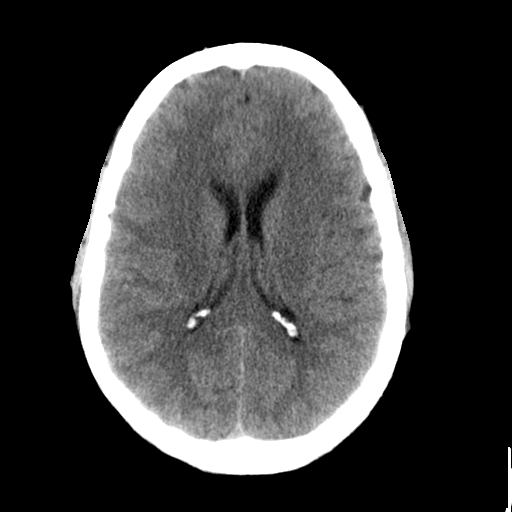
[im 20/32  brain]
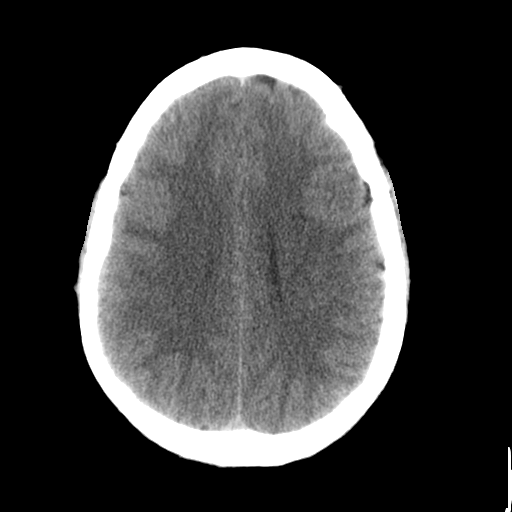
[im 20/32  bone]
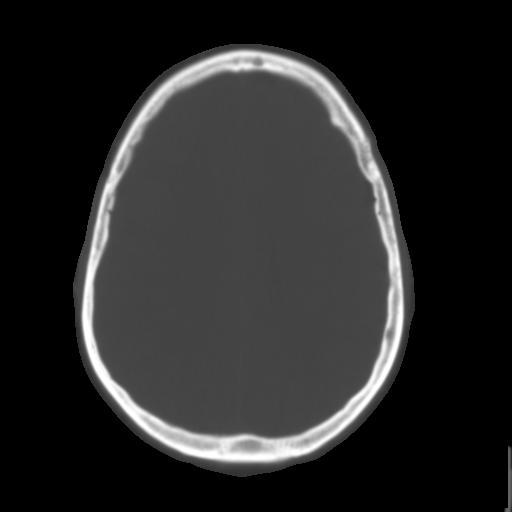
[im 23/32  brain]
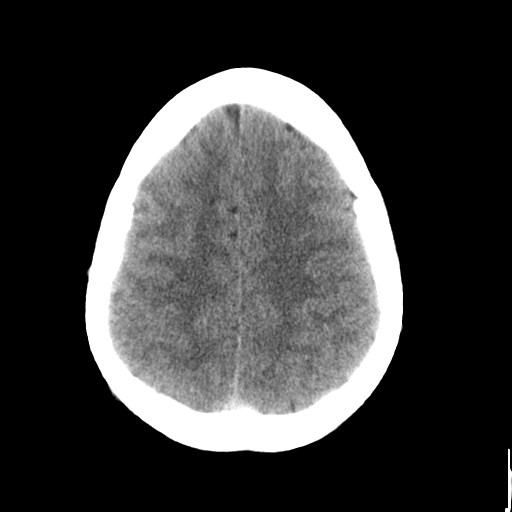
[im 25/32  brain]
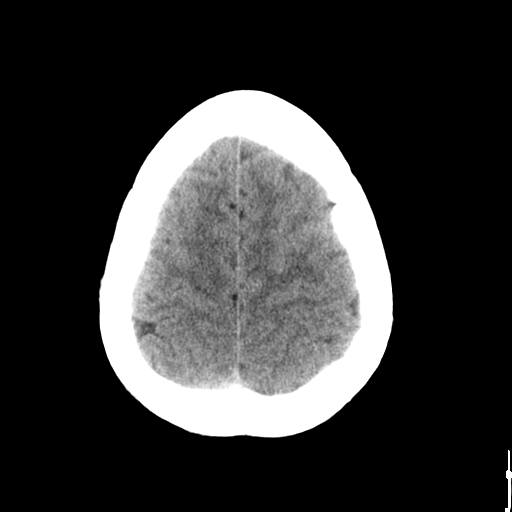
[im 27/32  brain]
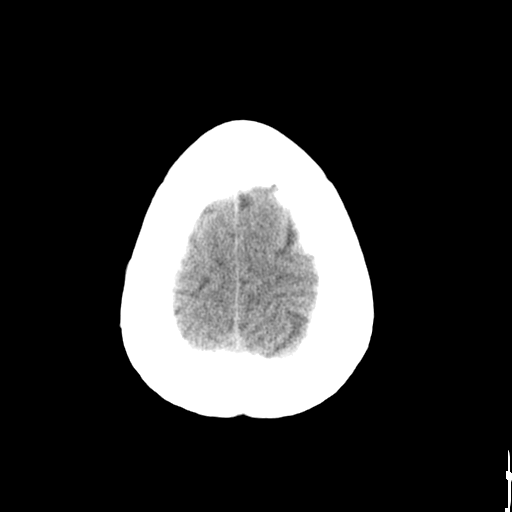
[im 29/32  brain]
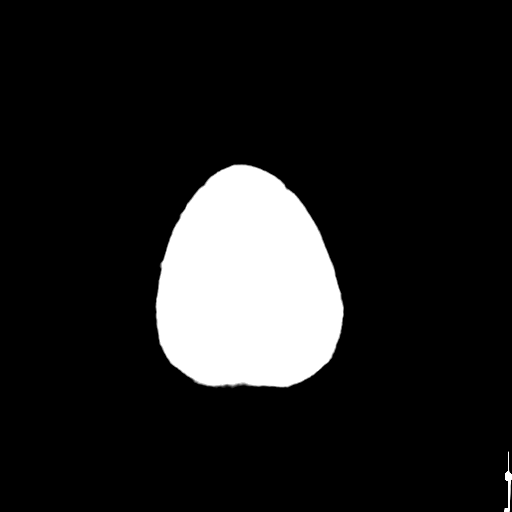
[im 29/32  bone]
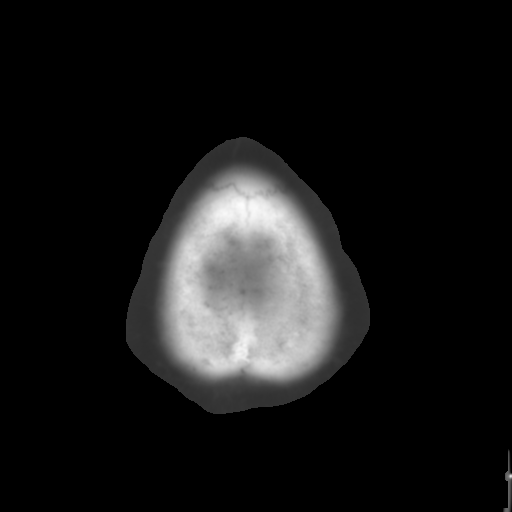

[Series 4: head wo recon · axial · 0.43mm/px · z∈[-143,-104]mm · 3 of 31 slices shown]
[im 3/31  brain]
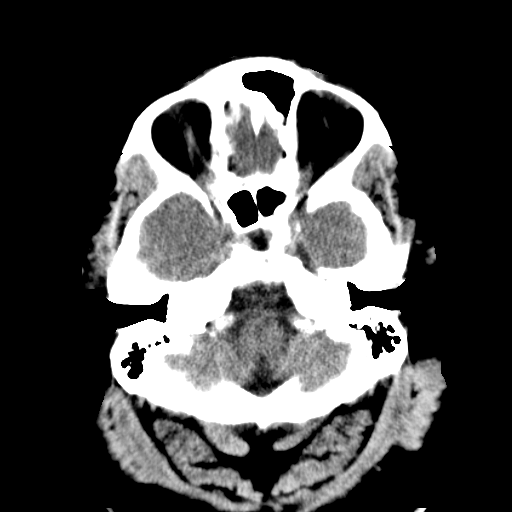
[im 7/31  brain]
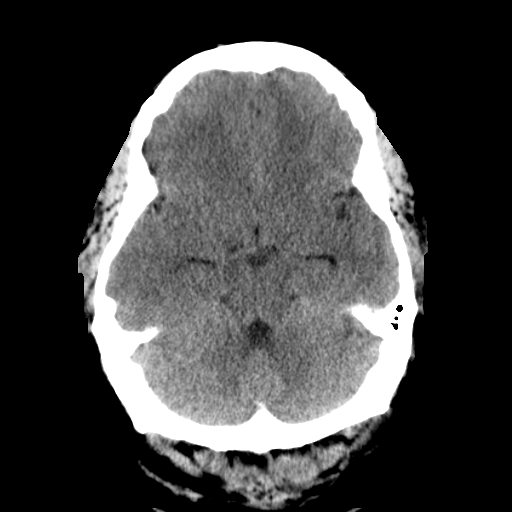
[im 11/31  brain]
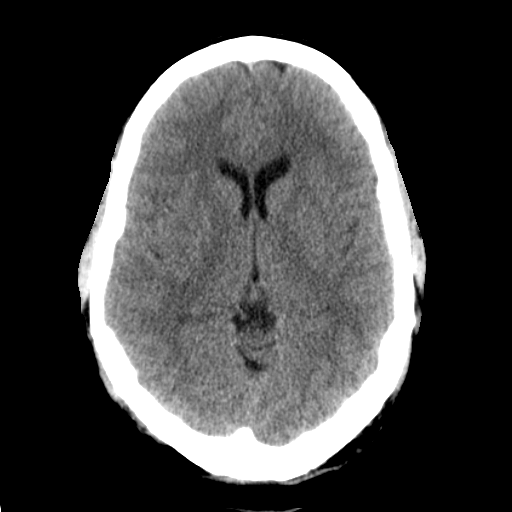

[16 of 30 positions shown; findings below may reference images not displayed]

FINDINGS: There is no acute intracranial hemorrhage or infarct. No mass lesion
or midline shift. Gray-white matter differentiation is well
maintained. Ventricles are normal in size without evidence of
hydrocephalus. CSF containing spaces are within normal limits. No
extra-axial fluid collection.

The calvarium is intact.

Orbital soft tissues are within normal limits.

Moderate mucosal thickening within the visualized right maxillary
sinus. Scattered opacity within the ethmoidal air cells and left
sphenoid sinus. Mastoid air cells are clear.

Scalp soft tissues are unremarkable.
IMPRESSION: 1. No acute intracranial process.
2. Mild paranasal sinus disease as above.

## 2016-10-27 IMAGING — CR DG CHEST 1V PORT
1 series · 1 of 1 positions shown · non-contrast
Comparison: Chest radiograph dated 04/18/2014

CLINICAL DATA: 49-year-old male with altered mental status and
combativeness.

EXAM:
PORTABLE CHEST 1 VIEW

[portable]
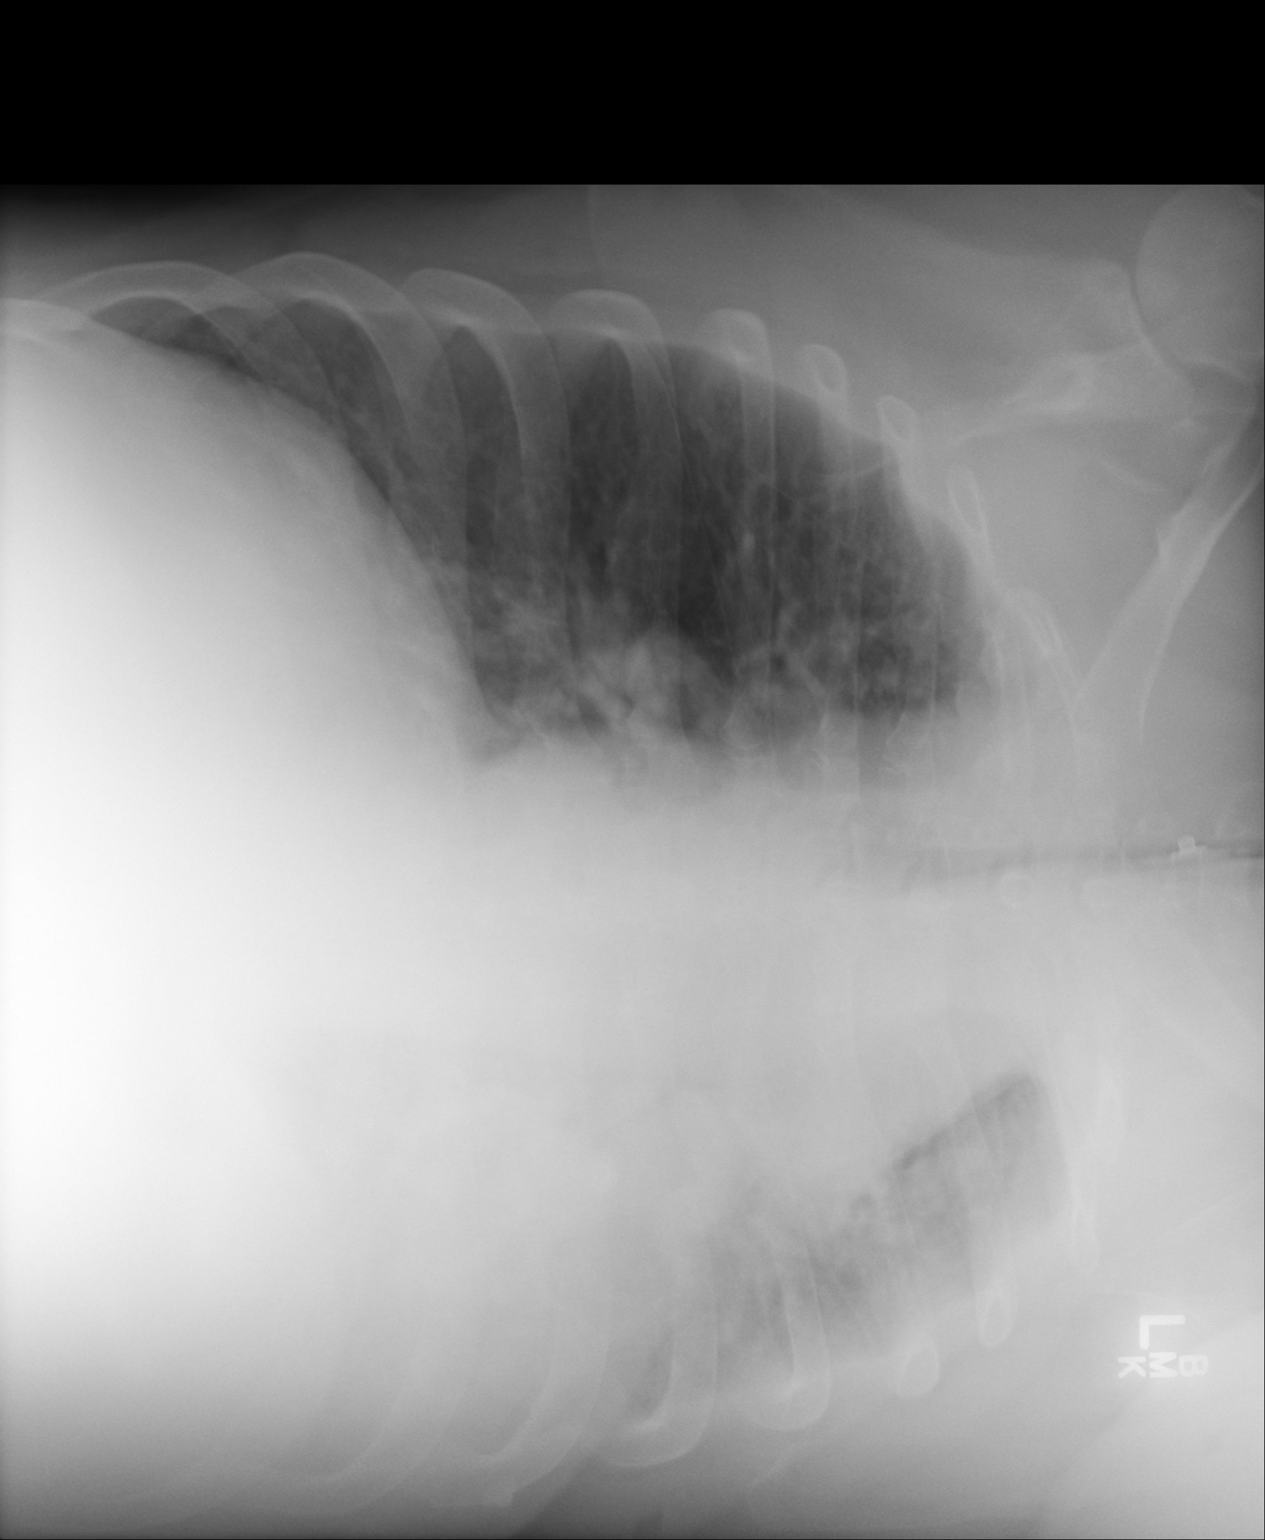

[1 of 1 positions shown; findings below may reference images not displayed]

FINDINGS: Evaluation is limited due to suboptimal patient positioning and
imaging. There is diffuse airspace opacity predominantly involving
the mid to lower lung field with decreased aeration of the left
lung. The right lung is clear. The cardiac silhouette appears
enlarged. There is apparent fullness of the superior mediastinum
with possible extrinsic compression and narrowing of the airway. A
mediastinal mass or hematoma if there is history of trauma is not
excluded. Clinical correlation is recommended. Repeat radiograph
with better patient positioning or CT is recommended for further
evaluation.
IMPRESSION: Diffuse left lung airspace opacity with prominence of the upper
mediastinum and apparent narrowing of the trachea. Repeat radiograph
with better patient positioning or CT is recommended for further
evaluation.

## 2016-12-19 ENCOUNTER — Ambulatory Visit (INDEPENDENT_AMBULATORY_CARE_PROVIDER_SITE_OTHER): Payer: 59 | Admitting: Internal Medicine

## 2016-12-19 ENCOUNTER — Encounter: Payer: Self-pay | Admitting: Internal Medicine

## 2016-12-19 VITALS — BP 156/90 | HR 87 | Temp 98.0°F | Ht 72.0 in | Wt 353.0 lb

## 2016-12-19 DIAGNOSIS — J40 Bronchitis, not specified as acute or chronic: Secondary | ICD-10-CM | POA: Diagnosis not present

## 2016-12-19 DIAGNOSIS — I10 Essential (primary) hypertension: Secondary | ICD-10-CM | POA: Diagnosis not present

## 2016-12-19 MED ORDER — GUAIFENESIN-CODEINE 100-10 MG/5ML PO SYRP: 5.0000 mL | ORAL_SOLUTION | Freq: Three times a day (TID) | ORAL | 0 refills | Status: DC | PRN

## 2016-12-19 MED ORDER — AMOXICILLIN-POT CLAVULANATE 875-125 MG PO TABS: 1.0000 | ORAL_TABLET | Freq: Two times a day (BID) | ORAL | 0 refills | Status: DC

## 2016-12-19 NOTE — Progress Notes (Signed)
Date:  12/19/2016   Name:  Bradley Love   DOB:  06/11/1966   MRN:  [id number]   Chief Complaint: Bronchitis Cough  This is a new problem. The current episode started in the past 7 days. The problem has been gradually worsening. The problem occurs every few minutes. The cough is productive of brown sputum. Associated symptoms include postnasal drip. Pertinent negatives include no chest pain, chills, ear pain, fever, sore throat or wheezing. The symptoms are aggravated by lying down. He has tried OTC cough suppressant for the symptoms. The treatment provided mild relief.       Review of Systems  Constitutional: Negative for chills, fatigue and fever.  HENT: Positive for postnasal drip. Negative for ear pain and sore throat.   Eyes: Negative for visual disturbance.  Respiratory: Positive for cough. Negative for chest tightness and wheezing.   Cardiovascular: Negative for chest pain.    Patient Active Problem List   Diagnosis Date Noted  . Obesity 09/13/2016  . Absolute anemia 09/13/2016  . Essential (primary) hypertension 07/27/2015  . Apnea, sleep 07/27/2015  . Encounter for screening for malignant neoplasm of prostate 07/27/2015    Prior to Admission medications   Medication Sig Start Date End Date Taking? Authorizing Provider  amLODipine (NORVASC) 10 MG tablet Take 1 tablet (10 mg total) by mouth daily. 09/13/16  Yes Reubin MilanLaura H Jacy Howat, MD  aspirin 81 MG tablet Take 1 tablet by mouth daily. 07/25/10  Yes Historical Provider, MD  carvedilol (COREG) 25 MG tablet Take 1 tablet (25 mg total) by mouth 2 (two) times daily. 09/13/16  Yes Reubin MilanLaura H Taqwa Deem, MD  Ferrous Sulfate (IRON) 325 (65 Fe) MG TABS Take 2 tablets by mouth daily. 08/08/10  Yes Historical Provider, MD  furosemide (LASIX) 20 MG tablet Take 1 tablet (20 mg total) by mouth daily. 09/13/16  Yes Reubin MilanLaura H Lovely Kerins, MD  losartan (COZAAR) 100 MG tablet Take 1 tablet (100 mg total) by mouth daily. 09/13/16  Yes Reubin MilanLaura H Gay Moncivais,  MD  Omega 3 1200 MG CAPS Take 1 capsule by mouth daily. 08/08/10  Yes Historical Provider, MD  potassium chloride (MICRO-K) 10 MEQ CR capsule Take 1 capsule by mouth daily. Reported on 12/29/2015 07/25/10  Yes Historical Provider, MD  albuterol (PROAIR HFA) 108 (90 Base) MCG/ACT inhaler Inhale 2 puffs into the lungs 4 (four) times daily as needed. 12/17/15 12/16/16  Historical Provider, MD  desloratadine (CLARINEX) 5 MG tablet Take 5 mg by mouth daily. 01/21/15 01/21/16  Historical Provider, MD    No Known Allergies  Past Surgical History:  Procedure Laterality Date  . CHOLECYSTECTOMY  2002  . CORNEAL TRANSPLANT Right 2010    Social History  Substance Use Topics  . Smoking status: Never Smoker  . Smokeless tobacco: Never Used  . Alcohol use 2.4 oz/week    4 Standard drinks or equivalent per week     Comment: occasionally     Medication list has been reviewed and updated.   Physical Exam  Constitutional: He is oriented to person, place, and time. He appears well-developed. No distress.  HENT:  Head: Normocephalic and atraumatic.  Right Ear: Tympanic membrane and ear canal normal.  Left Ear: Tympanic membrane and ear canal normal.  Mouth/Throat: Oropharynx is clear and moist.  Cardiovascular: Normal rate and regular rhythm.   Pulmonary/Chest: Effort normal. No respiratory distress. He has decreased breath sounds. He has no wheezes. He has no rhonchi.  Musculoskeletal: Normal range of motion.  Neurological:  He is alert and oriented to person, place, and time.  Skin: Skin is warm and dry. No rash noted.  Psychiatric: He has a normal mood and affect. His behavior is normal. Thought content normal.  Nursing note and vitals reviewed.   BP (!) 148/86   Pulse 87   Temp 98 F (36.7 C)   Ht 6' (1.829 m)   Wt (!) 353 lb (160.1 kg)   SpO2 97%   BMI 47.88 kg/m   Assessment and Plan: 1. Bronchitis Note for work - amoxicillin-clavulanate (AUGMENTIN) 875-125 MG tablet; Take 1 tablet  by mouth 2 (two) times daily.  Dispense: 20 tablet; Refill: 0 - guaiFENesin-codeine (ROBITUSSIN AC) 100-10 MG/5ML syrup; Take 5 mLs by mouth 3 (three) times daily as needed for cough.  Dispense: 150 mL; Refill: 0  2. Essential (primary) hypertension Improved Consider changing losartan to Benicar next visit   Bari Edward, MD [facility]  12/19/2016

## 2017-01-12 ENCOUNTER — Ambulatory Visit: Payer: 59 | Admitting: Internal Medicine

## 2017-02-13 ENCOUNTER — Encounter: Payer: Self-pay | Admitting: Internal Medicine

## 2017-02-13 ENCOUNTER — Ambulatory Visit (INDEPENDENT_AMBULATORY_CARE_PROVIDER_SITE_OTHER): Payer: 59 | Admitting: Internal Medicine

## 2017-02-13 VITALS — BP 128/82 | HR 108 | Temp 99.9°F | Ht 72.0 in | Wt 347.0 lb

## 2017-02-13 DIAGNOSIS — I10 Essential (primary) hypertension: Secondary | ICD-10-CM | POA: Diagnosis not present

## 2017-02-13 DIAGNOSIS — J40 Bronchitis, not specified as acute or chronic: Secondary | ICD-10-CM | POA: Diagnosis not present

## 2017-02-13 MED ORDER — AMLODIPINE BESYLATE 10 MG PO TABS: 10.0000 mg | ORAL_TABLET | Freq: Every day | ORAL | 1 refills | Status: DC

## 2017-02-13 MED ORDER — GUAIFENESIN-CODEINE 100-10 MG/5ML PO SYRP: 5.0000 mL | ORAL_SOLUTION | Freq: Three times a day (TID) | ORAL | 0 refills | Status: DC | PRN

## 2017-02-13 MED ORDER — LOSARTAN POTASSIUM 100 MG PO TABS: 100.0000 mg | ORAL_TABLET | Freq: Every day | ORAL | 1 refills | Status: DC

## 2017-02-13 MED ORDER — LEVOFLOXACIN 500 MG PO TABS: 500.0000 mg | ORAL_TABLET | Freq: Every day | ORAL | 0 refills | Status: DC

## 2017-02-13 MED ORDER — CARVEDILOL 25 MG PO TABS: 25.0000 mg | ORAL_TABLET | Freq: Two times a day (BID) | ORAL | 1 refills | Status: DC

## 2017-02-13 MED ORDER — FUROSEMIDE 20 MG PO TABS: 20.0000 mg | ORAL_TABLET | Freq: Every day | ORAL | 1 refills | Status: DC

## 2017-02-13 NOTE — Patient Instructions (Signed)
Take tylenol every 6 hours for fever and pain  Increase fluids

## 2017-02-13 NOTE — Progress Notes (Signed)
Date:  02/13/2017   Name:  Bradley Love   DOB:  December 21, 1965   MRN:  [id number]   Chief Complaint: Cough (X 2 days. Shoulder joints are sore. Yellow production.) Cough  This is a new problem. Episode onset: 2 days ago. The problem has been gradually worsening. The problem occurs every few minutes. The cough is productive of sputum. Associated symptoms include chills, ear pain, a fever, postnasal drip and shortness of breath. Pertinent negatives include no wheezing.  He's been exposed to sick coworkers, but his boss and the worker beside him. He was seen early in January with bronchitis from which he completely recovered.    Review of Systems  Constitutional: Positive for chills and fever.  HENT: Positive for ear pain and postnasal drip.   Eyes: Negative for visual disturbance.  Respiratory: Positive for cough and shortness of breath. Negative for chest tightness and wheezing.   Gastrointestinal: Negative for abdominal pain, diarrhea and vomiting.  Hematological: Negative for adenopathy.    Patient Active Problem List   Diagnosis Date Noted  . Obesity 09/13/2016  . Absolute anemia 09/13/2016  . Essential (primary) hypertension 07/27/2015  . Apnea, sleep 07/27/2015  . Encounter for screening for malignant neoplasm of prostate 07/27/2015    Prior to Admission medications   Medication Sig Start Date End Date Taking? Authorizing Provider  amLODipine (NORVASC) 10 MG tablet Take 1 tablet (10 mg total) by mouth daily. 09/13/16  Yes Reubin Milan, MD  aspirin 81 MG tablet Take 1 tablet by mouth daily. 07/25/10  Yes Historical Provider, MD  carvedilol (COREG) 25 MG tablet Take 1 tablet (25 mg total) by mouth 2 (two) times daily. 09/13/16  Yes Reubin Milan, MD  Ferrous Sulfate (IRON) 325 (65 Fe) MG TABS Take 2 tablets by mouth daily. 08/08/10  Yes Historical Provider, MD  furosemide (LASIX) 20 MG tablet Take 1 tablet (20 mg total) by mouth daily. 09/13/16  Yes Reubin Milan, MD    losartan (COZAAR) 100 MG tablet Take 1 tablet (100 mg total) by mouth daily. 09/13/16  Yes Reubin Milan, MD  Omega 3 1200 MG CAPS Take 1 capsule by mouth daily. 08/08/10  Yes Historical Provider, MD  potassium chloride (MICRO-K) 10 MEQ CR capsule Take 1 capsule by mouth daily. Reported on 12/29/2015 07/25/10  Yes Historical Provider, MD  albuterol (PROAIR HFA) 108 (90 Base) MCG/ACT inhaler Inhale 2 puffs into the lungs 4 (four) times daily as needed. 12/17/15 12/16/16  Historical Provider, MD       Reubin Milan, MD  desloratadine (CLARINEX) 5 MG tablet Take 5 mg by mouth daily. 01/21/15 01/21/16  Historical Provider, MD       Reubin Milan, MD    No Known Allergies  Past Surgical History:  Procedure Laterality Date  . CHOLECYSTECTOMY  2002  . CORNEAL TRANSPLANT Right 2010    Social History  Substance Use Topics  . Smoking status: Never Smoker  . Smokeless tobacco: Never Used  . Alcohol use 2.4 oz/week    4 Standard drinks or equivalent per week     Comment: occasionally     Medication list has been reviewed and updated.   Physical Exam  Constitutional: He is oriented to person, place, and time. He appears well-developed. No distress.  HENT:  Head: Normocephalic and atraumatic.  Right Ear: Tympanic membrane is not erythematous and not retracted.  Left Ear: Tympanic membrane is erythematous. Tympanic membrane is not retracted.  Nose:  Right sinus exhibits no maxillary sinus tenderness and no frontal sinus tenderness. Left sinus exhibits no maxillary sinus tenderness and no frontal sinus tenderness.  Mouth/Throat: No posterior oropharyngeal erythema.  Neck: Normal range of motion. Neck supple.  Cardiovascular: Normal rate, regular rhythm and normal heart sounds.   Pulmonary/Chest: Effort normal. No respiratory distress. He has no wheezes. He has no rales.  Neurological: He is alert and oriented to person, place, and time.  Skin: Skin is warm and dry. No rash noted.   Psychiatric: He has a normal mood and affect. His behavior is normal. Thought content normal.  Nursing note and vitals reviewed.   BP 128/82   Pulse (!) 108   Ht 6' (1.829 m)   Wt (!) 347 lb (157.4 kg)   SpO2 98%   BMI 47.06 kg/m   Assessment and Plan: 1. Bronchitis Tylenol for fever, pain Increase fluids Return if not improving  2. Essential (primary) hypertension Improved control - continue medications - losartan (COZAAR) 100 MG tablet; Take 1 tablet (100 mg total) by mouth daily.  Dispense: 90 tablet; Refill: 1 - furosemide (LASIX) 20 MG tablet; Take 1 tablet (20 mg total) by mouth daily.  Dispense: 90 tablet; Refill: 1 - carvedilol (COREG) 25 MG tablet; Take 1 tablet (25 mg total) by mouth 2 (two) times daily.  Dispense: 180 tablet; Refill: 1 - amLODipine (NORVASC) 10 MG tablet; Take 1 tablet (10 mg total) by mouth daily.  Dispense: 90 tablet; Refill: 1   Meds ordered this encounter  Medications  . levofloxacin (LEVAQUIN) 500 MG tablet    Sig: Take 1 tablet (500 mg total) by mouth daily.    Dispense:  7 tablet    Refill:  0  . losartan (COZAAR) 100 MG tablet    Sig: Take 1 tablet (100 mg total) by mouth daily.    Dispense:  90 tablet    Refill:  1  . furosemide (LASIX) 20 MG tablet    Sig: Take 1 tablet (20 mg total) by mouth daily.    Dispense:  90 tablet    Refill:  1  . carvedilol (COREG) 25 MG tablet    Sig: Take 1 tablet (25 mg total) by mouth 2 (two) times daily.    Dispense:  180 tablet    Refill:  1  . amLODipine (NORVASC) 10 MG tablet    Sig: Take 1 tablet (10 mg total) by mouth daily.    Dispense:  90 tablet    Refill:  1  . guaiFENesin-codeine (ROBITUSSIN AC) 100-10 MG/5ML syrup    Sig: Take 5 mLs by mouth 3 (three) times daily as needed for cough.    Dispense:  150 mL    Refill:  0    Bari EdwardLaura Berglund, MD [facility]  02/13/2017

## 2017-08-24 ENCOUNTER — Other Ambulatory Visit: Payer: Self-pay | Admitting: Internal Medicine

## 2017-08-24 ENCOUNTER — Telehealth: Payer: Self-pay

## 2017-08-24 DIAGNOSIS — I10 Essential (primary) hypertension: Secondary | ICD-10-CM

## 2017-08-24 MED ORDER — AMLODIPINE BESYLATE 10 MG PO TABS: 10.0000 mg | ORAL_TABLET | Freq: Every day | ORAL | 0 refills | Status: DC

## 2017-08-24 MED ORDER — LOSARTAN POTASSIUM 100 MG PO TABS: 100.0000 mg | ORAL_TABLET | Freq: Every day | ORAL | 0 refills | Status: DC

## 2017-08-24 MED ORDER — FUROSEMIDE 20 MG PO TABS: 20.0000 mg | ORAL_TABLET | Freq: Every day | ORAL | 0 refills | Status: DC

## 2017-08-24 MED ORDER — CARVEDILOL 25 MG PO TABS: 25.0000 mg | ORAL_TABLET | Freq: Two times a day (BID) | ORAL | 0 refills | Status: DC

## 2017-08-24 NOTE — Telephone Encounter (Signed)
Pt informed VM as requested.

## 2017-08-24 NOTE — Telephone Encounter (Signed)
Patient called and left VM trying to get refill on Losartan and Amlodipine. States he has 2 days left on both medications. Pt has not been seen for appt since 01/2017. Please Advise.

## 2017-08-24 NOTE — Telephone Encounter (Signed)
Done  Needs follow up

## 2017-10-30 ENCOUNTER — Encounter: Payer: Self-pay | Admitting: Internal Medicine

## 2017-10-30 ENCOUNTER — Ambulatory Visit: Payer: 59 | Admitting: Internal Medicine

## 2017-10-30 VITALS — BP 142/94 | HR 80 | Temp 98.3°F | Ht 72.0 in | Wt 339.0 lb

## 2017-10-30 DIAGNOSIS — J0100 Acute maxillary sinusitis, unspecified: Secondary | ICD-10-CM | POA: Diagnosis not present

## 2017-10-30 DIAGNOSIS — I10 Essential (primary) hypertension: Secondary | ICD-10-CM | POA: Diagnosis not present

## 2017-10-30 DIAGNOSIS — G4733 Obstructive sleep apnea (adult) (pediatric): Secondary | ICD-10-CM

## 2017-10-30 MED ORDER — AMLODIPINE BESYLATE 10 MG PO TABS: 10.0000 mg | ORAL_TABLET | Freq: Every day | ORAL | 1 refills | Status: DC

## 2017-10-30 MED ORDER — AMOXICILLIN-POT CLAVULANATE 875-125 MG PO TABS: 1.0000 | ORAL_TABLET | Freq: Two times a day (BID) | ORAL | 0 refills | Status: DC

## 2017-10-30 MED ORDER — LOSARTAN POTASSIUM 100 MG PO TABS: 100.0000 mg | ORAL_TABLET | Freq: Every day | ORAL | 1 refills | Status: DC

## 2017-10-30 MED ORDER — FUROSEMIDE 20 MG PO TABS: 20.0000 mg | ORAL_TABLET | Freq: Every day | ORAL | 1 refills | Status: DC

## 2017-10-30 MED ORDER — CARVEDILOL 25 MG PO TABS: 25.0000 mg | ORAL_TABLET | Freq: Two times a day (BID) | ORAL | 1 refills | Status: DC

## 2017-10-30 MED ORDER — BENZONATATE 100 MG PO CAPS: 100.0000 mg | ORAL_CAPSULE | Freq: Three times a day (TID) | ORAL | 0 refills | Status: DC

## 2017-10-30 NOTE — Progress Notes (Signed)
Date:  10/30/2017   Name:  Bradley Love   DOB:  10/22/1966   MRN:  [id number]   Chief Complaint: Cough (Coughing during the day and gets worse at night. Sometimes mucous, and having dry cough thats irritating throat at night.) and Hypertension Cough  This is a new problem. The cough is productive of blood-tinged sputum. Associated symptoms include postnasal drip. Pertinent negatives include no chest pain, chills, fever, sore throat or wheezing. The symptoms are aggravated by exercise and lying down. He has tried nothing for the symptoms. There is no history of environmental allergies.  Hypertension  This is a chronic problem. The problem is unchanged. The problem is controlled. Pertinent negatives include no chest pain or palpitations. Past treatments include angiotensin blockers, beta blockers, calcium channel blockers and diuretics. The current treatment provides significant improvement. Compliance problems: pharmacy left out amlodipine so hasnt taken it for several months.    OSA - using CPAP nightly and doing well. He needs a prescription to get replacement parts.   Review of Systems  Constitutional: Negative for chills, fatigue and fever.  HENT: Positive for congestion and postnasal drip. Negative for sore throat and trouble swallowing.   Respiratory: Positive for cough. Negative for chest tightness, wheezing and stridor.   Cardiovascular: Negative for chest pain, palpitations and leg swelling.  Gastrointestinal: Negative for abdominal pain.  Allergic/Immunologic: Negative for environmental allergies.    Patient Active Problem List   Diagnosis Date Noted  . Obesity 09/13/2016  . Absolute anemia 09/13/2016  . Essential (primary) hypertension 07/27/2015  . Apnea, sleep 07/27/2015  . Encounter for screening for malignant neoplasm of prostate 07/27/2015    Prior to Admission medications   Medication Sig Start Date End Date Taking? Authorizing Provider  amLODipine  (NORVASC) 10 MG tablet Take 1 tablet (10 mg total) by mouth daily. 08/24/17  Yes Reubin MilanBerglund, Memorie Yokoyama H, MD  aspirin 81 MG tablet Take 1 tablet by mouth daily. 07/25/10  Yes [provider]  carvedilol (COREG) 25 MG tablet Take 1 tablet (25 mg total) by mouth 2 (two) times daily. 08/24/17  Yes Reubin MilanBerglund, Criag Wicklund H, MD  Ferrous Sulfate (IRON) 325 (65 Fe) MG TABS Take 2 tablets by mouth daily. 08/08/10  Yes [provider]  furosemide (LASIX) 20 MG tablet Take 1 tablet (20 mg total) by mouth daily. 08/24/17  Yes Reubin MilanBerglund, Dejohn Ibarra H, MD  losartan (COZAAR) 100 MG tablet Take 1 tablet (100 mg total) by mouth daily. 08/24/17  Yes Reubin MilanBerglund, Zac Torti H, MD  Omega 3 1200 MG CAPS Take 1 capsule by mouth daily. 08/08/10  Yes [provider]  potassium chloride (MICRO-K) 10 MEQ CR capsule Take 1 capsule by mouth daily. Reported on 12/29/2015 07/25/10  Yes [provider]  albuterol (PROAIR HFA) 108 (90 Base) MCG/ACT inhaler Inhale 2 puffs into the lungs 4 (four) times daily as needed. 12/17/15 12/16/16  [provider]  desloratadine (CLARINEX) 5 MG tablet Take 5 mg by mouth daily. 01/21/15 01/21/16  [provider]    No Known Allergies  Past Surgical History:  Procedure Laterality Date  . CHOLECYSTECTOMY  2002  . CORNEAL TRANSPLANT Right 2010    Social History   Tobacco Use  . Smoking status: Never Smoker  . Smokeless tobacco: Never Used  Substance Use Topics  . Alcohol use: Yes    Alcohol/week: 2.4 oz    Types: 4 Standard drinks or equivalent per week    Comment: occasionally  . Drug use: No  Medication list has been reviewed and updated.  PHQ 2/9 Scores 10/30/2017  PHQ - 2 Score 0    Physical Exam  Constitutional: He is oriented to person, place, and time. He appears well-developed. No distress.  HENT:  Head: Normocephalic and atraumatic.  Right Ear: Tympanic membrane and ear canal normal.  Left Ear: Tympanic membrane and ear canal normal.  Nose: Right  sinus exhibits maxillary sinus tenderness. Left sinus exhibits maxillary sinus tenderness.  Mouth/Throat: No posterior oropharyngeal edema or posterior oropharyngeal erythema.  Neck: Normal range of motion. Neck supple.  Cardiovascular: Normal rate, regular rhythm and normal heart sounds.  Pulmonary/Chest: Effort normal and breath sounds normal. No respiratory distress. He has no wheezes.  Musculoskeletal: Normal range of motion. He exhibits no edema or tenderness.  Neurological: He is alert and oriented to person, place, and time.  Skin: Skin is warm and dry. No rash noted.  Psychiatric: He has a normal mood and affect. His behavior is normal. Thought content normal.  Nursing note and vitals reviewed.   BP (!) 142/94   Pulse 80   Ht 6' (1.829 m)   Wt (!) 339 lb (153.8 kg)   SpO2 97%   BMI 45.98 kg/m   Assessment and Plan: 1. Acute maxillary sinusitis, recurrence not specified - amoxicillin-clavulanate (AUGMENTIN) 875-125 MG tablet; Take 1 tablet 2 (two) times daily by mouth.  Dispense: 20 tablet; Refill: 0 - benzonatate (TESSALON) 100 MG capsule; Take 1 capsule (100 mg total) 3 (three) times daily by mouth.  Dispense: 20 capsule; Refill: 0  2. Essential (primary) hypertension Resume amlodipine and resume coreg bid - CBC with Differential/Platelet - Comprehensive metabolic panel - TSH - losartan (COZAAR) 100 MG tablet; Take 1 tablet (100 mg total) daily by mouth.  Dispense: 90 tablet; Refill: 1 - furosemide (LASIX) 20 MG tablet; Take 1 tablet (20 mg total) daily by mouth.  Dispense: 90 tablet; Refill: 1 - carvedilol (COREG) 25 MG tablet; Take 1 tablet (25 mg total) 2 (two) times daily by mouth.  Dispense: 180 tablet; Refill: 1 - amLODipine (NORVASC) 10 MG tablet; Take 1 tablet (10 mg total) daily by mouth.  Dispense: 90 tablet; Refill: 1  3. Obstructive sleep apnea syndrome Doing well on CPAP Rx for supplies written   Meds ordered this encounter  Medications  .  amoxicillin-clavulanate (AUGMENTIN) 875-125 MG tablet    Sig: Take 1 tablet 2 (two) times daily by mouth.    Dispense:  20 tablet    Refill:  0  . benzonatate (TESSALON) 100 MG capsule    Sig: Take 1 capsule (100 mg total) 3 (three) times daily by mouth.    Dispense:  20 capsule    Refill:  0  . losartan (COZAAR) 100 MG tablet    Sig: Take 1 tablet (100 mg total) daily by mouth.    Dispense:  90 tablet    Refill:  1  . furosemide (LASIX) 20 MG tablet    Sig: Take 1 tablet (20 mg total) daily by mouth.    Dispense:  90 tablet    Refill:  1  . carvedilol (COREG) 25 MG tablet    Sig: Take 1 tablet (25 mg total) 2 (two) times daily by mouth.    Dispense:  180 tablet    Refill:  1  . amLODipine (NORVASC) 10 MG tablet    Sig: Take 1 tablet (10 mg total) daily by mouth.    Dispense:  90 tablet    Refill:  1    Partially dictated using Animal nutritionist. Any errors are unintentional.  Bari Edward, MD [facility]  10/30/2017

## 2017-10-31 ENCOUNTER — Encounter: Payer: Self-pay | Admitting: Internal Medicine

## 2017-10-31 DIAGNOSIS — Z832 Family history of diseases of the blood and blood-forming organs and certain disorders involving the immune mechanism: Secondary | ICD-10-CM | POA: Insufficient documentation

## 2017-10-31 LAB — CBC WITH DIFFERENTIAL/PLATELET
Basophils Absolute: 0 10*3/uL (ref 0.0–0.2)
Basos: 0 %
EOS (ABSOLUTE): 0.2 10*3/uL (ref 0.0–0.4)
Eos: 2 %
Hematocrit: 33.3 % — ABNORMAL LOW (ref 37.5–51.0)
Hemoglobin: 11.2 g/dL — ABNORMAL LOW (ref 13.0–17.7)
Immature Grans (Abs): 0 10*3/uL (ref 0.0–0.1)
Immature Granulocytes: 0 %
Lymphocytes Absolute: 1.8 10*3/uL (ref 0.7–3.1)
Lymphs: 19 %
MCH: 22.2 pg — ABNORMAL LOW (ref 26.6–33.0)
MCHC: 33.6 g/dL (ref 31.5–35.7)
MCV: 66 fL — ABNORMAL LOW (ref 79–97)
Monocytes Absolute: 0.5 10*3/uL (ref 0.1–0.9)
Monocytes: 5 %
Neutrophils Absolute: 6.8 10*3/uL (ref 1.4–7.0)
Neutrophils: 74 %
Platelets: 126 10*3/uL — ABNORMAL LOW (ref 150–379)
RBC: 5.04 x10E6/uL (ref 4.14–5.80)
RDW: 20.9 % — ABNORMAL HIGH (ref 12.3–15.4)
WBC: 9.2 10*3/uL (ref 3.4–10.8)

## 2017-10-31 LAB — COMPREHENSIVE METABOLIC PANEL
ALK PHOS: 69 IU/L (ref 39–117)
ALT: 15 IU/L (ref 0–44)
AST: 15 IU/L (ref 0–40)
Albumin/Globulin Ratio: 1.8 (ref 1.2–2.2)
Albumin: 4.4 g/dL (ref 3.5–5.5)
BUN/Creatinine Ratio: 15 (ref 9–20)
BUN: 21 mg/dL (ref 6–24)
Bilirubin Total: 1.6 mg/dL — ABNORMAL HIGH (ref 0.0–1.2)
CALCIUM: 9.2 mg/dL (ref 8.7–10.2)
CO2: 24 mmol/L (ref 20–29)
CREATININE: 1.44 mg/dL — AB (ref 0.76–1.27)
Chloride: 105 mmol/L (ref 96–106)
GFR calc Af Amer: 64 mL/min/{1.73_m2} (ref >59)
GFR, EST NON AFRICAN AMERICAN: 55 mL/min/{1.73_m2} — AB (ref >59)
GLOBULIN, TOTAL: 2.5 g/dL (ref 1.5–4.5)
GLUCOSE: 75 mg/dL (ref 65–99)
Potassium: 4.3 mmol/L (ref 3.5–5.2)
SODIUM: 145 mmol/L — AB (ref 134–144)
Total Protein: 6.9 g/dL (ref 6.0–8.5)

## 2017-10-31 LAB — TSH: TSH: 1.63 u[IU]/mL (ref 0.450–4.500)

## 2017-11-19 ENCOUNTER — Telehealth: Payer: Self-pay

## 2017-11-19 NOTE — Telephone Encounter (Signed)
Patient called asking about the recall placed on Amlodipine medication. I informed him that since he is taking just amlodipine by itself with no combination medication he is in the clear unless later down the road they recall amlodipine by itself. He verbalized understanding- I informed him there is no need for changing his medication and he can continue to take it.

## 2017-12-03 ENCOUNTER — Other Ambulatory Visit: Payer: Self-pay

## 2017-12-03 DIAGNOSIS — I10 Essential (primary) hypertension: Secondary | ICD-10-CM

## 2017-12-03 MED ORDER — LOSARTAN POTASSIUM 100 MG PO TABS: 100.0000 mg | ORAL_TABLET | Freq: Every day | ORAL | 1 refills | Status: DC

## 2017-12-03 NOTE — Progress Notes (Signed)
Patient states been out of losartan since last Thursday. [facility]

## 2018-03-04 ENCOUNTER — Telehealth: Payer: Self-pay

## 2018-03-04 NOTE — Telephone Encounter (Signed)
Patient called and left a VM on Friday asking if he is safe to continue to take his losartan medication. [facility] [place] [facility] saying [facility] may have been [facility] and to contact his doctor for alternative medication.  Called [facility] and verified that the current supply he is taking has NOT been recalled. Called and left the patient a VM informing him of this. Told him to call office back if he has any questions.

## 2018-03-12 ENCOUNTER — Ambulatory Visit: Payer: 59 | Admitting: Internal Medicine

## 2018-03-12 ENCOUNTER — Encounter: Payer: Self-pay | Admitting: Internal Medicine

## 2018-03-12 VITALS — BP 140/98 | HR 72 | Temp 98.4°F | Ht 72.0 in | Wt 333.0 lb

## 2018-03-12 DIAGNOSIS — J40 Bronchitis, not specified as acute or chronic: Secondary | ICD-10-CM

## 2018-03-12 MED ORDER — AMOXICILLIN-POT CLAVULANATE 875-125 MG PO TABS: 1.0000 | ORAL_TABLET | Freq: Two times a day (BID) | ORAL | 0 refills | Status: AC

## 2018-03-12 MED ORDER — GUAIFENESIN-CODEINE 100-10 MG/5ML PO SYRP: 5.0000 mL | ORAL_SOLUTION | Freq: Every day | ORAL | 0 refills | Status: DC

## 2018-03-12 NOTE — Progress Notes (Signed)
Date:  03/12/2018   Name:  Bradley Love   DOB:  [birth year]   MRN:  [id number]   Chief Complaint: Cough (X 3 days- Coughing, ribs are sore from this. Yellow production. Sinus pressure. Last night "felt like" had fever. )  Cough  This is a new problem. The current episode started in the past 7 days. The problem has been unchanged. The problem occurs every few minutes. The cough is productive of sputum. Associated symptoms include chills, a fever, nasal congestion and wheezing. Pertinent negatives include no chest pain or ear pain. There is no history of environmental allergies.     Review of Systems  Constitutional: Positive for chills and fever.  HENT: Positive for sinus pressure. Negative for ear discharge and ear pain.   Eyes: Negative for visual disturbance.  Respiratory: Positive for cough and wheezing. Negative for chest tightness.   Cardiovascular: Negative for chest pain and palpitations.  Allergic/Immunologic: Negative for environmental allergies.    Patient Active Problem List   Diagnosis Date Noted  . Family history of sickle cell trait 10/31/2017  . Obesity 09/13/2016  . Absolute anemia 09/13/2016  . Essential (primary) hypertension 07/27/2015  . Apnea, sleep 07/27/2015  . Encounter for screening for malignant neoplasm of prostate 07/27/2015    Prior to Admission medications   Medication Sig Start Date End Date Taking? Authorizing Provider  albuterol (PROAIR HFA) 108 (90 Base) MCG/ACT inhaler Inhale 2 puffs into the lungs 4 (four) times daily as needed. 12/17/15 03/12/18 Yes [provider]  amLODipine (NORVASC) 10 MG tablet Take 1 tablet (10 mg total) daily by mouth. 10/30/17  Yes Reubin Milan, MD  aspirin 81 MG tablet Take 1 tablet by mouth daily. 07/25/10  Yes [provider]  carvedilol (COREG) 25 MG tablet Take 1 tablet (25 mg total) 2 (two) times daily by mouth. 10/30/17  Yes Reubin Milan, MD  desloratadine (CLARINEX) 5 MG tablet  Take 5 mg by mouth daily. 01/21/15 03/12/18 Yes [provider]  Ferrous Sulfate (IRON) 325 (65 Fe) MG TABS Take 2 tablets by mouth daily. 08/08/10  Yes [provider]  furosemide (LASIX) 20 MG tablet Take 1 tablet (20 mg total) daily by mouth. 10/30/17  Yes Reubin Milan, MD  losartan (COZAAR) 100 MG tablet Take 1 tablet (100 mg total) by mouth daily. 12/03/17  Yes Reubin Milan, MD  Omega 3 1200 MG CAPS Take 1 capsule by mouth daily. 08/08/10  Yes [provider]  potassium chloride (MICRO-K) 10 MEQ CR capsule Take 1 capsule by mouth daily. Reported on 12/29/2015 07/25/10  Yes [provider]  UNABLE TO FIND Med Name: CPAP @@ 4 cm H2O   Yes [provider]    No Known Allergies  Past Surgical History:  Procedure Laterality Date  . CHOLECYSTECTOMY  2002  . CORNEAL TRANSPLANT Right 2010    Social History   Tobacco Use  . Smoking status: Never Smoker  . Smokeless tobacco: Never Used  Substance Use Topics  . Alcohol use: Yes    Alcohol/week: 2.4 oz    Types: 4 Standard drinks or equivalent per week    Comment: occasionally  . Drug use: No     Medication list has been reviewed and updated.  PHQ 2/9 Scores 10/30/2017  PHQ - 2 Score 0    Physical Exam  Constitutional: He is oriented to person, place, and time. He appears well-developed. No distress.  HENT:  Head: Normocephalic and  atraumatic.  Right Ear: Tympanic membrane and ear canal normal.  Left Ear: Tympanic membrane and ear canal normal.  Nose: Right sinus exhibits maxillary sinus tenderness. Right sinus exhibits no frontal sinus tenderness. Left sinus exhibits maxillary sinus tenderness. Left sinus exhibits no frontal sinus tenderness.  Mouth/Throat: No posterior oropharyngeal edema or posterior oropharyngeal erythema.  Cardiovascular: Normal rate, regular rhythm and normal heart sounds.  Pulmonary/Chest: Effort normal. No respiratory distress. He has decreased breath  sounds. He has no wheezes. He has no rhonchi.  Musculoskeletal: Normal range of motion.  Neurological: He is alert and oriented to person, place, and time.  Skin: Skin is warm and dry. No rash noted.  Psychiatric: He has a normal mood and affect. His behavior is normal. Thought content normal.  Nursing note and vitals reviewed.   BP (!) 140/98   Pulse 72   Temp 98.4 F (36.9 C) (Oral)   Ht 6' (1.829 m)   Wt (!) 333 lb (151 kg)   SpO2 96%   BMI 45.16 kg/m   Assessment and Plan: 1. Bronchitis Continue Coricidin HBP and fluids - guaiFENesin-codeine (ROBITUSSIN AC) 100-10 MG/5ML syrup; Take 5 mLs by mouth at bedtime.  Dispense: 118 mL; Refill: 0 - amoxicillin-clavulanate (AUGMENTIN) 875-125 MG tablet; Take 1 tablet by mouth 2 (two) times daily for 10 days.  Dispense: 20 tablet; Refill: 0   Meds ordered this encounter  Medications  . guaiFENesin-codeine (ROBITUSSIN AC) 100-10 MG/5ML syrup    Sig: Take 5 mLs by mouth at bedtime.    Dispense:  118 mL    Refill:  0  . amoxicillin-clavulanate (AUGMENTIN) 875-125 MG tablet    Sig: Take 1 tablet by mouth 2 (two) times daily for 10 days.    Dispense:  20 tablet    Refill:  0    Partially dictated using Animal nutritionistDragon software. Any errors are unintentional.  Bari EdwardLaura Jalien Weakland, MD [facility]  03/12/2018

## 2018-04-29 ENCOUNTER — Ambulatory Visit: Payer: 59 | Admitting: Internal Medicine

## 2018-08-07 ENCOUNTER — Ambulatory Visit (INDEPENDENT_AMBULATORY_CARE_PROVIDER_SITE_OTHER): Payer: 59 | Admitting: Internal Medicine

## 2018-08-07 ENCOUNTER — Encounter: Payer: Self-pay | Admitting: Internal Medicine

## 2018-08-07 ENCOUNTER — Other Ambulatory Visit (HOSPITAL_COMMUNITY)
Admission: RE | Admit: 2018-08-07 | Discharge: 2018-08-07 | Disposition: A | Discharge status: Home / Self Care | Payer: 59 | Source: Ambulatory Visit | Attending: Internal Medicine | Admitting: Internal Medicine

## 2018-08-07 ENCOUNTER — Other Ambulatory Visit: Payer: Self-pay

## 2018-08-07 VITALS — BP 122/66 | HR 70 | Ht 72.0 in | Wt 344.0 lb

## 2018-08-07 DIAGNOSIS — Z125 Encounter for screening for malignant neoplasm of prostate: Secondary | ICD-10-CM | POA: Diagnosis not present

## 2018-08-07 DIAGNOSIS — Z0000 Encounter for general adult medical examination without abnormal findings: Secondary | ICD-10-CM

## 2018-08-07 DIAGNOSIS — Z1211 Encounter for screening for malignant neoplasm of colon: Secondary | ICD-10-CM

## 2018-08-07 DIAGNOSIS — G4733 Obstructive sleep apnea (adult) (pediatric): Secondary | ICD-10-CM

## 2018-08-07 DIAGNOSIS — I10 Essential (primary) hypertension: Secondary | ICD-10-CM

## 2018-08-07 DIAGNOSIS — Z113 Encounter for screening for infections with a predominantly sexual mode of transmission: Secondary | ICD-10-CM | POA: Diagnosis present

## 2018-08-07 DIAGNOSIS — Z1159 Encounter for screening for other viral diseases: Secondary | ICD-10-CM | POA: Diagnosis not present

## 2018-08-07 DIAGNOSIS — Z114 Encounter for screening for human immunodeficiency virus [HIV]: Secondary | ICD-10-CM

## 2018-08-07 DIAGNOSIS — D649 Anemia, unspecified: Secondary | ICD-10-CM

## 2018-08-07 LAB — POCT URINALYSIS DIPSTICK
Bilirubin, UA: NEGATIVE
Blood, UA: NEGATIVE
Glucose, UA: NEGATIVE
KETONES UA: NEGATIVE
LEUKOCYTES UA: NEGATIVE
NITRITE UA: NEGATIVE
PH UA: 5 (ref 5.0–8.0)
PROTEIN UA: NEGATIVE
Spec Grav, UA: <=1.005 — AB (ref 1.010–1.025)
UROBILINOGEN UA: 0.2 U/dL

## 2018-08-07 MED ORDER — LOSARTAN POTASSIUM 100 MG PO TABS: 100.0000 mg | ORAL_TABLET | Freq: Every day | ORAL | 1 refills | Status: DC

## 2018-08-07 MED ORDER — AMLODIPINE BESYLATE 10 MG PO TABS: 10.0000 mg | ORAL_TABLET | Freq: Every day | ORAL | 1 refills | Status: DC

## 2018-08-07 MED ORDER — CARVEDILOL 25 MG PO TABS: 25.0000 mg | ORAL_TABLET | Freq: Two times a day (BID) | ORAL | 1 refills | Status: DC

## 2018-08-07 MED ORDER — FUROSEMIDE 20 MG PO TABS: 20.0000 mg | ORAL_TABLET | Freq: Every day | ORAL | 1 refills | Status: DC

## 2018-08-07 NOTE — Progress Notes (Signed)
Date:  08/07/2018   Name:  Bradley Love Huie   DOB:  04/01/1966   MRN:  [id number]   Chief Complaint: Annual Exam Bradley Love Troxler is a 52 y.o. male who presents today for his Complete Annual Exam. He feels well. He reports exercising some. He reports he is sleeping well. He has never had a colonoscopy, or Hep C testing.  He would like to get HIV and STD testing but has no sx.  Hypertension  This is a chronic problem. The problem is controlled. Pertinent negatives include no chest pain, headaches, palpitations or shortness of breath. Past treatments include calcium channel blockers, angiotensin blockers, diuretics and beta blockers. The current treatment provides significant improvement.   Apnea - doing well on CPAP, using it nightly all night and feels well.    Review of Systems  Constitutional: Negative for appetite change, chills, diaphoresis, fatigue and unexpected weight change.  HENT: Negative for hearing loss, tinnitus, trouble swallowing and voice change.   Eyes: Negative for visual disturbance.  Respiratory: Negative for choking, shortness of breath and wheezing.   Cardiovascular: Negative for chest pain, palpitations and leg swelling.  Gastrointestinal: Negative for abdominal pain, blood in stool, constipation and diarrhea.  Genitourinary: Negative for difficulty urinating, dysuria and frequency.  Musculoskeletal: Negative for arthralgias, back pain and myalgias.  Skin: Negative for color change and rash.  Neurological: Negative for dizziness, syncope and headaches.  Hematological: Negative for adenopathy.  Psychiatric/Behavioral: Negative for dysphoric mood and sleep disturbance.    Patient Active Problem List   Diagnosis Date Noted  . Family history of sickle cell trait 10/31/2017  . Obesity 09/13/2016  . Absolute anemia 09/13/2016  . Essential (primary) hypertension 07/27/2015  . Apnea, sleep 07/27/2015  . Encounter for screening for malignant neoplasm of  prostate 07/27/2015    No Known Allergies  Past Surgical History:  Procedure Laterality Date  . CHOLECYSTECTOMY  2002  . CORNEAL TRANSPLANT Right 2010    Social History   Tobacco Use  . Smoking status: Never Smoker  . Smokeless tobacco: Never Used  Substance Use Topics  . Alcohol use: Yes    Alcohol/week: 4.0 standard drinks    Types: 4 Standard drinks or equivalent per week    Comment: occasionally  . Drug use: No     Medication list has been reviewed and updated.  Current Meds  Medication Sig  . albuterol (PROAIR HFA) 108 (90 Base) MCG/ACT inhaler Inhale 2 puffs into the lungs 4 (four) times daily as needed.  Marland Kitchen. amLODipine (NORVASC) 10 MG tablet Take 1 tablet (10 mg total) daily by mouth.  Marland Kitchen. aspirin 81 MG tablet Take 1 tablet by mouth daily.  . carvedilol (COREG) 25 MG tablet Take 1 tablet (25 mg total) 2 (two) times daily by mouth.  . desloratadine (CLARINEX) 5 MG tablet Take 5 mg by mouth daily.  . Ferrous Sulfate (IRON) 325 (65 Fe) MG TABS Take 2 tablets by mouth daily.  . furosemide (LASIX) 20 MG tablet Take 1 tablet (20 mg total) daily by mouth.  . losartan (COZAAR) 100 MG tablet Take 1 tablet (100 mg total) by mouth daily.  . Omega 3 1200 MG CAPS Take 1 capsule by mouth daily.  . potassium chloride (MICRO-Love) 10 MEQ CR capsule Take 1 capsule by mouth daily. Reported on 12/29/2015  . UNABLE TO FIND Med Name: CPAP @@ 4 cm H2O    PHQ 2/9 Scores 08/07/2018 10/30/2017  PHQ - 2 Score 0 0  Physical Exam  Constitutional: He is oriented to person, place, and time. He appears well-developed and well-nourished.  HENT:  Head: Normocephalic.  Right Ear: Tympanic membrane, external ear and ear canal normal.  Left Ear: Tympanic membrane, external ear and ear canal normal.  Nose: Nose normal.  Mouth/Throat: Uvula is midline and oropharynx is clear and moist.  Eyes: Pupils are equal, round, and reactive to light. Conjunctivae and EOM are normal.  Neck: Normal range of  motion. Neck supple. Carotid bruit is not present. No thyromegaly present.  Cardiovascular: Normal rate, regular rhythm, normal heart sounds and intact distal pulses.  Pulmonary/Chest: Effort normal and breath sounds normal. He has no wheezes. Right breast exhibits no mass. Left breast exhibits no mass.  Abdominal: Soft. Normal appearance and bowel sounds are normal. There is no hepatosplenomegaly. There is no tenderness.  Musculoskeletal: Normal range of motion.  Lymphadenopathy:    He has no cervical adenopathy.  Neurological: He is alert and oriented to person, place, and time. He has normal reflexes.  Skin: Skin is warm, dry and intact.  Psychiatric: He has a normal mood and affect. His speech is normal and behavior is normal. Judgment and thought content normal.  Nursing note and vitals reviewed.   BP 122/66 (BP Location: Right Arm, Patient Position: Sitting, Cuff Size: Normal)   Pulse 70   Ht 6' (1.829 m)   Wt (!) 344 lb (156 kg)   SpO2 97%   BMI 46.65 kg/m   Assessment and Plan: 1. Annual physical exam Work on weight loss - intermittent fasting 20 hr/day may be a good option for him - Lipid panel - POCT urinalysis dipstick  2. Essential (primary) hypertension Controlled on current regimen - CBC with Differential/Platelet - Comprehensive metabolic panel - TSH - amLODipine (NORVASC) 10 MG tablet; Take 1 tablet (10 mg total) by mouth daily.  Dispense: 90 tablet; Refill: 1 - losartan (COZAAR) 100 MG tablet; Take 1 tablet (100 mg total) by mouth daily.  Dispense: 90 tablet; Refill: 1 - furosemide (LASIX) 20 MG tablet; Take 1 tablet (20 mg total) by mouth daily.  Dispense: 90 tablet; Refill: 1 - carvedilol (COREG) 25 MG tablet; Take 1 tablet (25 mg total) by mouth 2 (two) times daily.  Dispense: 180 tablet; Refill: 1  3. Obstructive sleep apnea syndrome Doing well on nightly CPAP with excellent compliance  4. Encounter for screening for malignant neoplasm of prostate DRE  deferred by pt - PSA  5. Anemia, unspecified type monitoring  6. Colon cancer screening - Ambulatory referral to Gastroenterology  7. Encounter for screening for HIV - HIV antibody  8. Screening for STD (sexually transmitted disease) - GC/Chlamydia probe amp ([place])not at [facility]  9. Need for hepatitis C screening test - Hepatitis C antibody   Meds ordered this encounter  Medications  . amLODipine (NORVASC) 10 MG tablet    Sig: Take 1 tablet (10 mg total) by mouth daily.    Dispense:  90 tablet    Refill:  1  . losartan (COZAAR) 100 MG tablet    Sig: Take 1 tablet (100 mg total) by mouth daily.    Dispense:  90 tablet    Refill:  1  . furosemide (LASIX) 20 MG tablet    Sig: Take 1 tablet (20 mg total) by mouth daily.    Dispense:  90 tablet    Refill:  1  . carvedilol (COREG) 25 MG tablet    Sig: Take 1 tablet (25 mg total)  by mouth 2 (two) times daily.    Dispense:  180 tablet    Refill:  1    Partially dictated using Animal nutritionistDragon software. Any errors are unintentional.  Bari EdwardLaura Yovanny Coats, MD [facility]  08/07/2018  There are no diagnoses linked to this encounter.

## 2018-08-07 NOTE — Patient Instructions (Signed)
Try "intermittent fasting" type diet

## 2018-08-08 LAB — CBC WITH DIFFERENTIAL/PLATELET
BASOS: 0 %
Basophils Absolute: 0 10*3/uL (ref 0.0–0.2)
EOS (ABSOLUTE): 0.1 10*3/uL (ref 0.0–0.4)
Eos: 1 %
HEMATOCRIT: 33 % — AB (ref 37.5–51.0)
Hemoglobin: 11.5 g/dL — ABNORMAL LOW (ref 13.0–17.7)
IMMATURE GRANS (ABS): 0 10*3/uL (ref 0.0–0.1)
Immature Granulocytes: 0 %
LYMPHS: 24 %
Lymphocytes Absolute: 2 10*3/uL (ref 0.7–3.1)
MCH: 23 pg — ABNORMAL LOW (ref 26.6–33.0)
MCHC: 34.8 g/dL (ref 31.5–35.7)
MCV: 66 fL — AB (ref 79–97)
MONOCYTES: 4 %
Monocytes Absolute: 0.4 10*3/uL (ref 0.1–0.9)
NEUTROS ABS: 5.9 10*3/uL (ref 1.4–7.0)
Neutrophils: 71 %
PLATELETS: 101 10*3/uL — AB (ref 150–450)
RBC: 4.99 x10E6/uL (ref 4.14–5.80)
RDW: 19.6 % — ABNORMAL HIGH (ref 12.3–15.4)
WBC: 8.3 10*3/uL (ref 3.4–10.8)

## 2018-08-08 LAB — HIV ANTIBODY (ROUTINE TESTING W REFLEX): HIV Screen 4th Generation wRfx: NONREACTIVE

## 2018-08-08 LAB — TSH: TSH: 2.84 u[IU]/mL (ref 0.450–4.500)

## 2018-08-08 LAB — COMPREHENSIVE METABOLIC PANEL
A/G RATIO: 1.9 (ref 1.2–2.2)
ALT: 13 IU/L (ref 0–44)
AST: 15 IU/L (ref 0–40)
Albumin: 4.5 g/dL (ref 3.5–5.5)
Alkaline Phosphatase: 67 IU/L (ref 39–117)
BUN/Creatinine Ratio: 15 (ref 9–20)
BUN: 21 mg/dL (ref 6–24)
Bilirubin Total: 1.4 mg/dL — ABNORMAL HIGH (ref 0.0–1.2)
CALCIUM: 9.4 mg/dL (ref 8.7–10.2)
CO2: 22 mmol/L (ref 20–29)
CREATININE: 1.41 mg/dL — AB (ref 0.76–1.27)
Chloride: 106 mmol/L (ref 96–106)
GFR, EST AFRICAN AMERICAN: 65 mL/min/{1.73_m2} (ref >59)
GFR, EST NON AFRICAN AMERICAN: 56 mL/min/{1.73_m2} — AB (ref >59)
GLOBULIN, TOTAL: 2.4 g/dL (ref 1.5–4.5)
Glucose: 112 mg/dL — ABNORMAL HIGH (ref 65–99)
POTASSIUM: 3.9 mmol/L (ref 3.5–5.2)
Sodium: 144 mmol/L (ref 134–144)
TOTAL PROTEIN: 6.9 g/dL (ref 6.0–8.5)

## 2018-08-08 LAB — LIPID PANEL
CHOL/HDL RATIO: 4.2 ratio (ref 0.0–5.0)
Cholesterol, Total: 162 mg/dL (ref 100–199)
HDL: 39 mg/dL — AB (ref >39)
LDL CALC: 104 mg/dL — AB (ref 0–99)
Triglycerides: 97 mg/dL (ref 0–149)
VLDL CHOLESTEROL CAL: 19 mg/dL (ref 5–40)

## 2018-08-08 LAB — GC/CHLAMYDIA PROBE AMP (~~LOC~~) NOT AT ARMC
Chlamydia: NEGATIVE
NEISSERIA GONORRHEA: NEGATIVE

## 2018-08-08 LAB — PSA: PROSTATE SPECIFIC AG, SERUM: 0.8 ng/mL (ref 0.0–4.0)

## 2018-08-08 LAB — HEPATITIS C ANTIBODY: Hep C Virus Ab: <0.1 s/co ratio (ref 0.0–0.9)

## 2018-08-26 ENCOUNTER — Other Ambulatory Visit: Payer: Self-pay

## 2018-08-26 ENCOUNTER — Encounter: Payer: Self-pay | Admitting: *Deleted

## 2018-08-29 ENCOUNTER — Encounter: Payer: Self-pay | Admitting: Anesthesiology

## 2018-09-10 DIAGNOSIS — I110 Hypertensive heart disease with heart failure: Secondary | ICD-10-CM | POA: Diagnosis not present

## 2018-09-10 DIAGNOSIS — S161XXA Strain of muscle, fascia and tendon at neck level, initial encounter: Secondary | ICD-10-CM | POA: Diagnosis not present

## 2018-09-10 DIAGNOSIS — I509 Heart failure, unspecified: Secondary | ICD-10-CM | POA: Diagnosis not present

## 2018-09-10 DIAGNOSIS — M542 Cervicalgia: Secondary | ICD-10-CM | POA: Diagnosis not present

## 2018-09-12 ENCOUNTER — Ambulatory Visit: Admission: RE | Admit: 2018-09-12 | Payer: 59 | Source: Ambulatory Visit | Admitting: Gastroenterology

## 2018-09-12 HISTORY — DX: Sleep apnea, unspecified: G47.30

## 2018-09-12 HISTORY — DX: Anemia, unspecified: D64.9

## 2018-09-12 SURGERY — COLONOSCOPY WITH PROPOFOL
Anesthesia: Choice

## 2018-11-11 ENCOUNTER — Encounter: Payer: Self-pay | Admitting: Internal Medicine

## 2018-11-11 ENCOUNTER — Ambulatory Visit (INDEPENDENT_AMBULATORY_CARE_PROVIDER_SITE_OTHER): Payer: 59 | Admitting: Internal Medicine

## 2018-11-11 VITALS — BP 132/82 | HR 78 | Resp 16 | Ht 72.0 in | Wt 347.0 lb

## 2018-11-11 DIAGNOSIS — I10 Essential (primary) hypertension: Secondary | ICD-10-CM | POA: Diagnosis not present

## 2018-11-11 DIAGNOSIS — Z23 Encounter for immunization: Secondary | ICD-10-CM | POA: Diagnosis not present

## 2018-11-11 DIAGNOSIS — J3489 Other specified disorders of nose and nasal sinuses: Secondary | ICD-10-CM | POA: Diagnosis not present

## 2018-11-11 DIAGNOSIS — Z6841 Body Mass Index (BMI) 40.0 and over, adult: Secondary | ICD-10-CM

## 2018-11-11 NOTE — Progress Notes (Signed)
Date:  11/11/2018   Name:  Bradley DissJames K Moffet   DOB:  02/26/1966   MRN:  [id number]   Chief Complaint: Nose Problem (has nasal drip on only L side and did have past issues of Cerebral Spinal Fluid Draining and was infected that caused Meningitis. Nose running x 5 days )  Hypertension  Pertinent negatives include no chest pain, headaches, palpitations or shortness of breath. (Sleep apnea) Past treatments include beta blockers, calcium channel blockers and angiotensin blockers. The current treatment provides significant improvement. Identifiable causes of hypertension include sleep apnea.   Obesity - pt has morbid obesity.  Since high school his weight was 240 lbs and it has steadily increased since then.  He has HTN and sleep apnea and is very interested in gastric bypass.  He will need a letter to be forwarded to his insurance for approval.  Rhinorrhea - drip on the left side, no fever, chills, ear pain, sore throat.  He is concerned because his episode of pneumococcal sepsis in 2016 started as a runny nose.   Review of Systems  Constitutional: Negative for chills, diaphoresis and fever.  HENT: Positive for rhinorrhea. Negative for ear pain, sore throat and trouble swallowing.   Eyes: Negative for visual disturbance.  Respiratory: Negative for cough, chest tightness, shortness of breath and wheezing.   Cardiovascular: Negative for chest pain and palpitations.  Allergic/Immunologic: Negative for environmental allergies.  Neurological: Negative for dizziness, light-headedness and headaches.    Patient Active Problem List   Diagnosis Date Noted  . Family history of sickle cell trait 10/31/2017  . BMI 40.0-44.9, adult (HCC) 09/13/2016  . Absolute anemia 09/13/2016  . Essential (primary) hypertension 07/27/2015  . Apnea, sleep 07/27/2015  . Encounter for screening for malignant neoplasm of prostate 07/27/2015    No Known Allergies  Past Surgical History:  Procedure Laterality Date   . CHOLECYSTECTOMY  2002  . CORNEAL TRANSPLANT Right 2010    Social History   Tobacco Use  . Smoking status: Former Smoker    Types: Cigarettes    Last attempt to quit: 1997    Years since quitting: 22.9  . Smokeless tobacco: Never Used  Substance Use Topics  . Alcohol use: Yes    Alcohol/week: 0.0 standard drinks    Comment: occasionally -3-4x/yr  . Drug use: No     Medication list has been reviewed and updated.  Current Meds  Medication Sig  . albuterol (PROAIR HFA) 108 (90 Base) MCG/ACT inhaler Inhale 2 puffs into the lungs 4 (four) times daily as needed.  Marland Kitchen. amLODipine (NORVASC) 10 MG tablet Take 1 tablet (10 mg total) by mouth daily.  Marland Kitchen. aspirin 81 MG tablet Take 1 tablet by mouth daily.  . carvedilol (COREG) 25 MG tablet Take 1 tablet (25 mg total) by mouth 2 (two) times daily.  . Ferrous Sulfate (IRON) 325 (65 Fe) MG TABS Take 2 tablets by mouth daily.  . furosemide (LASIX) 20 MG tablet Take 1 tablet (20 mg total) by mouth daily.  Marland Kitchen. losartan (COZAAR) 100 MG tablet Take 1 tablet (100 mg total) by mouth daily.  . Omega 3 1200 MG CAPS Take 1 capsule by mouth daily.  . potassium chloride (MICRO-K) 10 MEQ CR capsule Take 1 capsule by mouth daily. Reported on 12/29/2015  . UNABLE TO FIND Med Name: CPAP @@ 4 cm H2O  . [DISCONTINUED] guaiFENesin-codeine (ROBITUSSIN AC) 100-10 MG/5ML syrup Take 5 mLs by mouth at bedtime.    PHQ 2/9 Scores 08/07/2018  10/30/2017  PHQ - 2 Score 0 0    Physical Exam  Constitutional: He appears well-developed and well-nourished.  HENT:  Head: Normocephalic.  Right Ear: Tympanic membrane and ear canal normal.  Left Ear: Tympanic membrane and ear canal normal.  Nose: Nose normal.  Eyes: Pupils are equal, round, and reactive to light. EOM are normal.  Neck: Normal range of motion. Neck supple.  Cardiovascular: Normal rate, regular rhythm and normal heart sounds.  Pulmonary/Chest: Effort normal and breath sounds normal.  Lymphadenopathy:    He  has no cervical adenopathy.    BP 132/82   Pulse 78   Resp 16   Ht 6' (1.829 m)   Wt (!) 347 lb (157.4 kg)   SpO2 100%   BMI 47.06 kg/m   Assessment and Plan: 1. Rhinorrhea Use clartin or allegra as needed  2. Morbid obesity with BMI of 45.0-49.9, adult [facility]) Will write a letter for insurance - pt will pick up to forward to the appropriate person  3. Essential (primary) hypertension controlled  4. Need for vaccination for pneumococcus - Pneumococcal conjugate vaccine 13-valent IM   Partially dictated using Animal nutritionist. Any errors are unintentional.  Bari Edward, MD [facility]  11/11/2018

## 2018-11-11 NOTE — Patient Instructions (Addendum)
Claritin or Allegra for nasal drainage  Pneumococcal Conjugate Vaccine (PCV13) What You Need to Know 1. Why get vaccinated? Vaccination can protect both children and adults from pneumococcal disease. Pneumococcal disease is caused by bacteria that can spread from person to person through close contact. It can cause ear infections, and it can also lead to more serious infections of the:  Lungs (pneumonia),  Blood (bacteremia), and  Covering of the brain and spinal cord (meningitis).  Pneumococcal pneumonia is most common among adults. Pneumococcal meningitis can cause deafness and brain damage, and it kills about 1 child in 10 who get it. Anyone can get pneumococcal disease, but children under 542 years of age and adults 1165 years and older, people with certain medical conditions, and cigarette smokers are at the highest risk. Before there was a vaccine, the [place] saw:  more than 700 cases of meningitis,  about 13,000 blood infections,  about 5 million ear infections, and  about 200 deaths  in children under 5 each year from pneumococcal disease. Since vaccine became available, severe pneumococcal disease in these children has fallen by 88%. About 18,000 older adults die of pneumococcal disease each year in the [place]. Treatment of pneumococcal infections with penicillin and other drugs is not as effective as it used to be, because some strains of the disease have become resistant to these drugs. This makes prevention of the disease, through vaccination, even more important. 2. PCV13 vaccine Pneumococcal conjugate vaccine (called PCV13) protects against 13 types of pneumococcal bacteria. PCV13 is routinely given to children at 2, 4, 6, and 7712-6415 months of age. It is also recommended for children and adults 702 to 54 years of age with certain health conditions, and for all adults 54 years of age and older. Your doctor can give you details. 3. Some people should not get this  vaccine Anyone who has ever had a life-threatening allergic reaction to a dose of this vaccine, to an earlier pneumococcal vaccine called PCV7, or to any vaccine containing diphtheria toxoid (for example, DTaP), should not get PCV13. Anyone with a severe allergy to any component of PCV13 should not get the vaccine. Tell your doctor if the person being vaccinated has any severe allergies. If the person scheduled for vaccination is not feeling well, your healthcare provider might decide to reschedule the shot on another day. 4. Risks of a vaccine reaction With any medicine, including vaccines, there is a chance of reactions. These are usually mild and go away on their own, but serious reactions are also possible. Problems reported following PCV13 varied by age and dose in the series. The most common problems reported among children were:  About half became drowsy after the shot, had a temporary loss of appetite, or had redness or tenderness where the shot was given.  About 1 out of 3 had swelling where the shot was given.  About 1 out of 3 had a mild fever, and about 1 in 20 had a fever over 102.18F.  Up to about 8 out of 10 became fussy or irritable.  Adults have reported pain, redness, and swelling where the shot was given; also mild fever, fatigue, headache, chills, or muscle pain. Young children who get PCV13 along with inactivated flu vaccine at the same time may be at increased risk for seizures caused by fever. Ask your doctor for more information. Problems that could happen after any vaccine:  People sometimes faint after a medical procedure, including vaccination. Sitting or lying down  for about 15 minutes can help prevent fainting, and injuries caused by a fall. Tell your doctor if you feel dizzy, or have vision changes or ringing in the ears.  Some older children and adults get severe pain in the shoulder and have difficulty moving the arm where a shot was given. This happens very  rarely.  Any medication can cause a severe allergic reaction. Such reactions from a vaccine are very rare, estimated at about 1 in a million doses, and would happen within a few minutes to a few hours after the vaccination. As with any medicine, there is a very small chance of a vaccine causing a serious injury or death. The safety of vaccines is always being monitored. For more information, visit: http://floyd.org/ 5. What if there is a serious reaction? What should I look for? Look for anything that concerns you, such as signs of a severe allergic reaction, very high fever, or unusual behavior. Signs of a severe allergic reaction can include hives, swelling of the face and throat, difficulty breathing, a fast heartbeat, dizziness, and weakness-usually within a few minutes to a few hours after the vaccination. What should I do?  If you think it is a severe allergic reaction or other emergency that can't wait, call 9-1-1 or get the person to the nearest hospital. Otherwise, call your doctor.  Reactions should be reported to the Vaccine Adverse Event Reporting System (VAERS). Your doctor should file this report, or you can do it yourself through the VAERS web site at www.vaers.LAgents.no, or by calling 1-347-840-6304. ? VAERS does not give medical advice. 6. The National Vaccine Injury Compensation Program The [employer] Vaccine Injury Compensation Program (VICP) is a federal program that was created to compensate people who may have been injured by certain vaccines. Persons who believe they may have been injured by a vaccine can learn about the program and about filing a claim by calling 1-773-079-6286 or visiting the VICP website at SpiritualWord.at. There is a time limit to file a claim for compensation. 7. How can I learn more?  Ask your healthcare provider. He or she can give you the vaccine package insert or suggest other sources of information.  Call your local or  state health department.  Contact the Centers for Disease Control and Prevention (CDC): ? Call (772)595-4367 (1-800-CDC-INFO) or ? Visit CDC's website at PicCapture.uy Vaccine Information Statement, PCV13 Vaccine (10/22/2014) This information is not intended to replace advice given to you by your health care provider. Make sure you discuss any questions you have with your health care provider. Document Released: 10/01/2006 Document Revised: 08/24/2016 Document Reviewed: 08/24/2016 Elsevier Interactive Patient Education  2017 [employer].

## 2019-01-21 ENCOUNTER — Ambulatory Visit: Payer: Self-pay | Admitting: Internal Medicine

## 2019-02-07 ENCOUNTER — Ambulatory Visit: Payer: 59 | Admitting: Internal Medicine

## 2019-02-07 DIAGNOSIS — G4733 Obstructive sleep apnea (adult) (pediatric): Secondary | ICD-10-CM | POA: Insufficient documentation

## 2019-02-07 NOTE — Progress Notes (Deleted)
    Date:  02/07/2019   Name:  Bradley Love   DOB:  1966/11/25   MRN:  [id number]   Chief Complaint: No chief complaint on file.  Hypertension  This is a chronic problem. The problem is controlled. Past treatments include diuretics, beta blockers, calcium channel blockers and angiotensin blockers. The current treatment provides significant improvement.   OSA - on nightly CPAP.  CRC screening - had colonoscopy scheduled in September but procedure was cancelled.   Review of Systems  Patient Active Problem List   Diagnosis Date Noted  . Obstructive sleep apnea syndrome 02/07/2019  . Family history of sickle cell trait 10/31/2017  . Morbid obesity with BMI of 45.0-49.9, adult (HCC) 09/13/2016  . Absolute anemia 09/13/2016  . Essential (primary) hypertension 07/27/2015  . Encounter for screening for malignant neoplasm of prostate 07/27/2015    No Known Allergies  Past Surgical History:  Procedure Laterality Date  . CHOLECYSTECTOMY  2002  . CORNEAL TRANSPLANT Right 2010    Social History   Tobacco Use  . Smoking status: Former Smoker    Types: Cigarettes    Last attempt to quit: 1997    Years since quitting: 23.1  . Smokeless tobacco: Never Used  Substance Use Topics  . Alcohol use: Yes    Alcohol/week: 0.0 standard drinks    Comment: occasionally -3-4x/yr  . Drug use: No     Medication list has been reviewed and updated.  No outpatient medications have been marked as taking for the 02/07/19 encounter (Appointment) with Reubin Milan, MD.    [facility] 2/9 Scores 08/07/2018 10/30/2017  PHQ - 2 Score 0 0    Physical Exam  There were no vitals taken for this visit.  Assessment and Plan:

## 2019-03-03 MED ORDER — FLUTICASONE PROPIONATE 50 MCG/ACT NA SUSP
2.00 | NASAL | Status: DC
Start: 2019-03-04 — End: 2019-03-03

## 2019-03-03 MED ORDER — LOSARTAN POTASSIUM 100 MG PO TABS
100.00 | ORAL_TABLET | ORAL | Status: DC
Start: 2019-03-04 — End: 2019-03-03

## 2019-03-03 MED ORDER — CARVEDILOL 25 MG PO TABS
25.00 | ORAL_TABLET | ORAL | Status: DC
Start: 2019-03-03 — End: 2019-03-03

## 2019-03-03 MED ORDER — ACETAMINOPHEN 500 MG PO TABS
1000.00 | ORAL_TABLET | ORAL | Status: DC
Start: ? — End: 2019-03-03

## 2019-03-03 MED ORDER — NIFEDIPINE ER OSMOTIC RELEASE 30 MG PO TB24
60.00 | ORAL_TABLET | ORAL | Status: DC
Start: 2019-03-04 — End: 2019-03-03

## 2019-03-03 MED ORDER — SODIUM CHLORIDE FLUSH 0.9 % IV SOLN
10.00 | INTRAVENOUS | Status: DC
Start: 2019-03-03 — End: 2019-03-03

## 2019-03-03 MED ORDER — CARBOXYMETHYLCELLULOSE SODIUM 0.5 % OP SOLN
1.00 | OPHTHALMIC | Status: DC
Start: ? — End: 2019-03-03

## 2019-03-03 MED ORDER — GENERIC EXTERNAL MEDICATION
2.00 | Status: DC
Start: 2019-03-04 — End: 2019-03-03

## 2019-03-03 MED ORDER — HEPARIN SODIUM (PORCINE) 10000 UNIT/ML IJ SOLN
7500.00 | INTRAMUSCULAR | Status: DC
Start: 2019-03-03 — End: 2019-03-03

## 2019-04-17 ENCOUNTER — Other Ambulatory Visit: Payer: Self-pay | Admitting: Internal Medicine

## 2019-04-17 DIAGNOSIS — I10 Essential (primary) hypertension: Secondary | ICD-10-CM

## 2019-04-21 ENCOUNTER — Other Ambulatory Visit: Payer: Self-pay

## 2019-04-21 DIAGNOSIS — I10 Essential (primary) hypertension: Secondary | ICD-10-CM

## 2019-08-04 ENCOUNTER — Other Ambulatory Visit: Payer: Self-pay

## 2019-08-04 DIAGNOSIS — Z20822 Contact with and (suspected) exposure to covid-19: Secondary | ICD-10-CM

## 2019-08-05 LAB — NOVEL CORONAVIRUS, NAA: SARS-CoV-2, NAA: NOT DETECTED

## 2019-08-11 ENCOUNTER — Encounter: Payer: 59 | Admitting: Internal Medicine

## 2019-08-12 ENCOUNTER — Other Ambulatory Visit: Payer: Self-pay | Admitting: Internal Medicine

## 2019-08-12 DIAGNOSIS — I10 Essential (primary) hypertension: Secondary | ICD-10-CM

## 2019-08-18 NOTE — Telephone Encounter (Signed)
LM to call back.

## 2019-10-05 ENCOUNTER — Other Ambulatory Visit: Payer: Self-pay | Admitting: Internal Medicine

## 2019-10-05 DIAGNOSIS — I10 Essential (primary) hypertension: Secondary | ICD-10-CM

## 2019-10-07 NOTE — Telephone Encounter (Signed)
LM to call back.

## 2019-10-21 ENCOUNTER — Telehealth: Payer: Self-pay

## 2019-10-21 NOTE — Telephone Encounter (Signed)
Patient informed. Scheduled next Tuesday at 3 PM.

## 2019-10-21 NOTE — Telephone Encounter (Signed)
Patient called saying at his last visit he discussed his interest in having [place] Bypass due to his obesity. Wants a referral sent to [facility] at Fax# [phone].  Please Advise?  Benedict Needy, CMA

## 2019-10-21 NOTE — Telephone Encounter (Signed)
He needs an OV for HTN and obesity.  We can discuss the referral at that time.

## 2019-10-28 ENCOUNTER — Encounter: Payer: Self-pay | Admitting: Internal Medicine

## 2019-10-28 ENCOUNTER — Ambulatory Visit: Payer: BC Managed Care – PPO | Admitting: Internal Medicine

## 2019-10-28 ENCOUNTER — Other Ambulatory Visit: Payer: Self-pay

## 2019-10-28 VITALS — BP 132/82 | HR 76 | Ht 72.0 in | Wt 372.0 lb

## 2019-10-28 DIAGNOSIS — I10 Essential (primary) hypertension: Secondary | ICD-10-CM | POA: Diagnosis not present

## 2019-10-28 DIAGNOSIS — J3489 Other specified disorders of nose and nasal sinuses: Secondary | ICD-10-CM | POA: Diagnosis not present

## 2019-10-28 DIAGNOSIS — Z23 Encounter for immunization: Secondary | ICD-10-CM

## 2019-10-28 DIAGNOSIS — Z6841 Body Mass Index (BMI) 40.0 and over, adult: Secondary | ICD-10-CM

## 2019-10-28 DIAGNOSIS — N522 Drug-induced erectile dysfunction: Secondary | ICD-10-CM

## 2019-10-28 MED ORDER — SILDENAFIL CITRATE 50 MG PO TABS: 25.0000 mg | ORAL_TABLET | Freq: Every day | ORAL | 0 refills | Status: DC | PRN

## 2019-10-28 MED ORDER — CARVEDILOL 25 MG PO TABS: 25.0000 mg | ORAL_TABLET | Freq: Two times a day (BID) | ORAL | 5 refills | Status: DC

## 2019-10-28 MED ORDER — FUROSEMIDE 20 MG PO TABS: 20.0000 mg | ORAL_TABLET | Freq: Every day | ORAL | 5 refills | Status: DC

## 2019-10-28 MED ORDER — AZELASTINE HCL 0.1 % NA SOLN: 1.0000 | Freq: Two times a day (BID) | NASAL | 1 refills | Status: DC

## 2019-10-28 NOTE — Progress Notes (Signed)
Date:  10/28/2019   Name:  Bradley Love   DOB:  [birth year]   MRN:  [id number]   Chief Complaint: Hypertension (Flu shot.) and Obesity (Wants referral to Bariatric Surgeon. - [facility]. FAX # [phone])  Hypertension This is a chronic problem. The problem is controlled. Pertinent negatives include no chest pain, headaches, palpitations or shortness of breath. Past treatments include beta blockers, calcium channel blockers, diuretics and angiotensin blockers.  Obesity - he has decided that he wants to pursue gastric bypass surgery.  He has several cousins who have done it and feel great.  His weight has been steadily increasing over the past 4 years. ED - he describes mild ED - he has an erection sufficient for intercourse but it does not last as long as he would like.  He thinks that his BP meds interfere. Rhinorrhea - he noted chronic clear drainage from the left nostril.  Flonase and anti-histamines have not been beneficial.  He has not consulted ENT.   Lab Results  Component Value Date   CREATININE 1.41 (H) 08/07/2018   BUN 21 08/07/2018   NA 144 08/07/2018   K 3.9 08/07/2018   CL 106 08/07/2018   CO2 22 08/07/2018   Lab Results  Component Value Date   CHOL 162 08/07/2018   HDL 39 (L) 08/07/2018   LDLCALC 104 (H) 08/07/2018   TRIG 97 08/07/2018   CHOLHDL 4.2 08/07/2018   Lab Results  Component Value Date   TSH 2.840 08/07/2018   No results found for: HGBA1C   Review of Systems  Constitutional: Positive for unexpected weight change. Negative for chills, fatigue and fever.  HENT: Positive for rhinorrhea (clear drainage from left nostril frequently).   Respiratory: Negative for choking, shortness of breath and wheezing.   Cardiovascular: Negative for chest pain and palpitations.  Gastrointestinal: Negative for abdominal pain, constipation and diarrhea.  Genitourinary: Negative for difficulty urinating, frequency and urgency.  Neurological: Negative for  dizziness, light-headedness and headaches.  Psychiatric/Behavioral: Negative for dysphoric mood and sleep disturbance. The patient is not nervous/anxious.     Patient Active Problem List   Diagnosis Date Noted  . Obstructive sleep apnea syndrome 02/07/2019  . Family history of sickle cell trait 10/31/2017  . Morbid obesity with BMI of 45.0-49.9, adult (HCC) 09/13/2016  . Absolute anemia 09/13/2016  . Essential (primary) hypertension 07/27/2015  . Encounter for screening for malignant neoplasm of prostate 07/27/2015    No Known Allergies  Past Surgical History:  Procedure Laterality Date  . CHOLECYSTECTOMY  2002  . CORNEAL TRANSPLANT Right 2010    Social History   Tobacco Use  . Smoking status: Former Smoker    Types: Cigarettes    Quit date: 1997    Years since quitting: 23.8  . Smokeless tobacco: Never Used  Substance Use Topics  . Alcohol use: Yes    Alcohol/week: 0.0 standard drinks    Comment: occasionally -3-4x/yr  . Drug use: No     Medication list has been reviewed and updated.  Current Meds  Medication Sig  . albuterol (PROAIR HFA) 108 (90 Base) MCG/ACT inhaler Inhale 2 puffs into the lungs 4 (four) times daily as needed.  Marland Kitchen amLODipine (NORVASC) 10 MG tablet Take 1 tablet by mouth once daily  . aspirin 81 MG tablet Take 1 tablet by mouth daily.  . carvedilol (COREG) 25 MG tablet Take 1 tablet by mouth twice daily  . desloratadine (CLARINEX) 5 MG tablet Take 5 mg  by mouth daily as needed.   . Ferrous Sulfate (IRON) 325 (65 Fe) MG TABS Take 2 tablets by mouth daily.  . furosemide (LASIX) 20 MG tablet Take 1 tablet by mouth once daily  . losartan (COZAAR) 100 MG tablet Take 1 tablet by mouth once daily  . Omega 3 1200 MG CAPS Take 1 capsule by mouth daily.  . potassium chloride (MICRO-K) 10 MEQ CR capsule Take 1 capsule by mouth daily. Reported on 12/29/2015  . UNABLE TO FIND Med Name: CPAP @@ 4 cm H2O    PHQ 2/9 Scores 10/28/2019 08/07/2018 10/30/2017  PHQ  - 2 Score 0 0 0    BP Readings from Last 3 Encounters:  10/28/19 132/82  11/11/18 132/82  08/07/18 122/66    Physical Exam Vitals signs and nursing note reviewed.  Constitutional:      General: He is not in acute distress.    Appearance: He is well-developed. He is obese.  HENT:     Head: Normocephalic and atraumatic.     Nose: Nose normal. No congestion or rhinorrhea.     Right Sinus: No maxillary sinus tenderness or frontal sinus tenderness.     Left Sinus: No maxillary sinus tenderness or frontal sinus tenderness.  Neck:     Musculoskeletal: Normal range of motion.  Cardiovascular:     Rate and Rhythm: Normal rate and regular rhythm.     Pulses: Normal pulses.     Heart sounds: No murmur.  Pulmonary:     Effort: Pulmonary effort is normal. No respiratory distress.     Breath sounds: No wheezing or rhonchi.  Musculoskeletal:     Right lower leg: No edema.     Left lower leg: No edema.  Lymphadenopathy:     Cervical: No cervical adenopathy.  Skin:    General: Skin is warm and dry.     Capillary Refill: Capillary refill takes less than 2 seconds.     Findings: Lesion (on left lower leg) present. No rash.  Neurological:     Mental Status: He is alert and oriented to person, place, and time.  Psychiatric:        Behavior: Behavior normal.        Thought Content: Thought content normal.     Wt Readings from Last 3 Encounters:  10/28/19 (!) 372 lb (168.7 kg)  11/11/18 (!) 347 lb (157.4 kg)  08/07/18 (!) 344 lb (156 kg)    BP 132/82   Pulse 76   Ht 6' (1.829 m)   Wt (!) 372 lb (168.7 kg)   SpO2 95%   BMI 50.45 kg/m   Assessment and Plan: 1. Essential (primary) hypertension Clinically stable exam with well controlled BP.   Tolerating medications without side effects at this time. Pt to continue current regimen and low sodium diet; benefits of regular exercise as able discussed. - carvedilol (COREG) 25 MG tablet; Take 1 tablet (25 mg total) by mouth 2 (two)  times daily.  Dispense: 60 tablet; Refill: 5 - furosemide (LASIX) 20 MG tablet; Take 1 tablet (20 mg total) by mouth daily.  Dispense: 30 tablet; Refill: 5  2. Rhinorrhea Will try astelin nasal spray - azelastine (ASTELIN) 0.1 % nasal spray; Place 1 spray into both nostrils 2 (two) times daily. Use in each nostril as directed  Dispense: 30 mL; Refill: 1  3. Drug-induced erectile dysfunction Recommend low dose viagra 12.5 - 25 mg daily PRN - sildenafil (VIAGRA) 50 MG tablet; Take 0.5 tablets (25 mg  total) by mouth daily as needed for erectile dysfunction.  Dispense: 10 tablet; Refill: 0  4. BMI 50.0-59.9, adult (HCC) - Amb Referral to Bariatric Surgery  5. Need for immunization against influenza - Flu Vaccine QUAD 36+ mos IM   Partially dictated using Editor, commissioning. Any errors are unintentional.  Halina Maidens, MD [facility]  10/28/2019

## 2019-12-22 DIAGNOSIS — Z6841 Body Mass Index (BMI) 40.0 and over, adult: Secondary | ICD-10-CM | POA: Diagnosis not present

## 2019-12-22 DIAGNOSIS — Z9884 Bariatric surgery status: Secondary | ICD-10-CM | POA: Diagnosis not present

## 2020-01-09 DIAGNOSIS — U071 COVID-19: Secondary | ICD-10-CM | POA: Diagnosis not present

## 2020-01-21 DIAGNOSIS — Z6841 Body Mass Index (BMI) 40.0 and over, adult: Secondary | ICD-10-CM | POA: Diagnosis not present

## 2020-01-21 DIAGNOSIS — E669 Obesity, unspecified: Secondary | ICD-10-CM | POA: Diagnosis not present

## 2020-01-21 DIAGNOSIS — Z713 Dietary counseling and surveillance: Secondary | ICD-10-CM | POA: Diagnosis not present

## 2020-01-27 DIAGNOSIS — U071 COVID-19: Secondary | ICD-10-CM | POA: Diagnosis not present

## 2020-02-04 DIAGNOSIS — U071 COVID-19: Secondary | ICD-10-CM | POA: Diagnosis not present

## 2020-02-10 DIAGNOSIS — U071 COVID-19: Secondary | ICD-10-CM | POA: Diagnosis not present

## 2020-04-22 ENCOUNTER — Other Ambulatory Visit: Payer: Self-pay | Admitting: Internal Medicine

## 2020-04-22 DIAGNOSIS — I10 Essential (primary) hypertension: Secondary | ICD-10-CM

## 2020-04-22 NOTE — Telephone Encounter (Signed)
Requested Prescriptions  Pending Prescriptions Disp Refills  . losartan (COZAAR) 100 MG tablet [Pharmacy Med Name: Losartan Potassium 100 MG Oral Tablet] 90 tablet 0    Sig: Take 1 tablet by mouth once daily     Cardiovascular:  Angiotensin Receptor Blockers Failed - 04/22/2020  9:38 AM      Failed - Cr in normal range and within 180 days    Creatinine  Date Value Ref Range Status  04/18/2014 1.41 (H) 0.60 - 1.30 mg/dL Final   Creatinine, Ser  Date Value Ref Range Status  08/07/2018 1.41 (H) 0.76 - 1.27 mg/dL Final         Failed - K in normal range and within 180 days    Potassium  Date Value Ref Range Status  08/07/2018 3.9 3.5 - 5.2 mmol/L Final  04/18/2014 3.6 3.5 - 5.1 mmol/L Final         Passed - Patient is not pregnant      Passed - Last BP in normal range    BP Readings from Last 1 Encounters:  10/28/19 132/82         Passed - Valid encounter within last 6 months    Recent Outpatient Visits          5 months ago Drug-induced erectile dysfunction   [facility] Reubin Milan, MD   1 year ago Rhinorrhea   [facility] Reubin Milan, MD   1 year ago Annual physical exam   [facility] Reubin Milan, MD   2 years ago Bronchitis   [facility] Reubin Milan, MD   2 years ago Acute maxillary sinusitis, recurrence not specified   [facility] Reubin Milan, MD              Attempted to call pt. To schedule 6 mo office appt., which is due this mo.  Left vm to call and schedule appt.  Gave refill #90/ no refills at this time.

## 2020-05-20 ENCOUNTER — Telehealth: Payer: Self-pay | Admitting: Internal Medicine

## 2020-05-20 NOTE — Telephone Encounter (Signed)
Pt scheduled next available appt with Dr. Judithann Graves for a refill on his losartan (COZAAR) 100 MG tablet  /Pt asked if he can get a courtesy refill until his appt next week/ Pt stated when he picked up his last refill at the pharmacy he was only given 30 tabs when the refill says 90/ please advise

## 2020-05-21 ENCOUNTER — Other Ambulatory Visit: Payer: Self-pay

## 2020-05-21 DIAGNOSIS — I10 Essential (primary) hypertension: Secondary | ICD-10-CM

## 2020-05-21 MED ORDER — LOSARTAN POTASSIUM 100 MG PO TABS: 100.0000 mg | ORAL_TABLET | Freq: Every day | ORAL | 0 refills | Status: DC

## 2020-05-21 NOTE — Telephone Encounter (Signed)
Sent in #30. Pt cannot get anymore refills until being seen for HTN follow up.  CM

## 2020-05-26 ENCOUNTER — Encounter: Payer: Self-pay | Admitting: Internal Medicine

## 2020-05-26 ENCOUNTER — Other Ambulatory Visit: Payer: Self-pay

## 2020-05-26 ENCOUNTER — Ambulatory Visit (INDEPENDENT_AMBULATORY_CARE_PROVIDER_SITE_OTHER): Payer: 59 | Admitting: Internal Medicine

## 2020-05-26 VITALS — BP 140/88 | HR 91 | Temp 98.1°F | Ht 72.0 in | Wt 362.0 lb

## 2020-05-26 DIAGNOSIS — I10 Essential (primary) hypertension: Secondary | ICD-10-CM | POA: Diagnosis not present

## 2020-05-26 DIAGNOSIS — G4733 Obstructive sleep apnea (adult) (pediatric): Secondary | ICD-10-CM

## 2020-05-26 DIAGNOSIS — Z6841 Body Mass Index (BMI) 40.0 and over, adult: Secondary | ICD-10-CM

## 2020-05-26 MED ORDER — LOSARTAN POTASSIUM 100 MG PO TABS: 100.0000 mg | ORAL_TABLET | Freq: Every day | ORAL | 5 refills | Status: DC

## 2020-05-26 MED ORDER — CARVEDILOL 25 MG PO TABS: 25.0000 mg | ORAL_TABLET | Freq: Two times a day (BID) | ORAL | 5 refills | Status: DC

## 2020-05-26 MED ORDER — FUROSEMIDE 20 MG PO TABS: 20.0000 mg | ORAL_TABLET | Freq: Every day | ORAL | 5 refills | Status: DC

## 2020-05-26 NOTE — Progress Notes (Signed)
Date:  05/26/2020   Name:  Bradley Love   DOB:  [birth year]   MRN:  [id number]   Chief Complaint: Hypertension  Hypertension This is a chronic problem. The problem is uncontrolled. Pertinent negatives include no chest pain, headaches, palpitations or shortness of breath. Past treatments include beta blockers, calcium channel blockers, diuretics and angiotensin blockers. The current treatment provides significant improvement. Compliance problems: ran out of refills and the pharmacy had to get straightened out.     Lab Results  Component Value Date   CREATININE 1.41 (H) 08/07/2018   BUN 21 08/07/2018   NA 144 08/07/2018   K 3.9 08/07/2018   CL 106 08/07/2018   CO2 22 08/07/2018   Lab Results  Component Value Date   CHOL 162 08/07/2018   HDL 39 (L) 08/07/2018   LDLCALC 104 (H) 08/07/2018   TRIG 97 08/07/2018   CHOLHDL 4.2 08/07/2018   Lab Results  Component Value Date   TSH 2.840 08/07/2018   No results found for: HGBA1C Lab Results  Component Value Date   WBC 8.3 08/07/2018   HGB 11.5 (L) 08/07/2018   HCT 33.0 (L) 08/07/2018   MCV 66 (L) 08/07/2018   PLT 101 (L) 08/07/2018   Lab Results  Component Value Date   ALT 13 08/07/2018   AST 15 08/07/2018   ALKPHOS 67 08/07/2018   BILITOT 1.4 (H) 08/07/2018     Review of Systems  Constitutional: Negative for chills, fatigue, fever and unexpected weight change.  Respiratory: Negative for cough, chest tightness and shortness of breath.   Cardiovascular: Negative for chest pain, palpitations and leg swelling.  Gastrointestinal: Negative for abdominal pain.  Neurological: Negative for dizziness, light-headedness and headaches.  Psychiatric/Behavioral: Negative for dysphoric mood and sleep disturbance. The patient is not nervous/anxious.     Patient Active Problem List   Diagnosis Date Noted  . Rhinorrhea 10/28/2019  . Obstructive sleep apnea syndrome 02/07/2019  . Family history of sickle cell trait 10/31/2017   . Morbid obesity with BMI of 45.0-49.9, adult (HCC) 09/13/2016  . Absolute anemia 09/13/2016  . Essential (primary) hypertension 07/27/2015  . Encounter for screening for malignant neoplasm of prostate 07/27/2015    No Known Allergies  Past Surgical History:  Procedure Laterality Date  . CHOLECYSTECTOMY  2002  . CORNEAL TRANSPLANT Right 2010    Social History   Tobacco Use  . Smoking status: Former Smoker    Types: Cigarettes    Quit date: 1997    Years since quitting: 24.4  . Smokeless tobacco: Never Used  Substance Use Topics  . Alcohol use: Yes    Alcohol/week: 0.0 standard drinks    Comment: occasionally -3-4x/yr  . Drug use: No     Medication list has been reviewed and updated.  Current Meds  Medication Sig  . albuterol (PROAIR HFA) 108 (90 Base) MCG/ACT inhaler Inhale 2 puffs into the lungs 4 (four) times daily as needed.  Marland Kitchen amLODipine (NORVASC) 10 MG tablet Take 1 tablet by mouth once daily  . aspirin 81 MG tablet Take 1 tablet by mouth daily.  Marland Kitchen azelastine (ASTELIN) 0.1 % nasal spray Place 1 spray into both nostrils 2 (two) times daily. Use in each nostril as directed  . carvedilol (COREG) 25 MG tablet Take 1 tablet (25 mg total) by mouth 2 (two) times daily.  . Ferrous Sulfate (IRON) 325 (65 Fe) MG TABS Take 2 tablets by mouth daily.  . furosemide (LASIX) 20 MG tablet Take  1 tablet (20 mg total) by mouth daily.  Marland Kitchen losartan (COZAAR) 100 MG tablet Take 1 tablet (100 mg total) by mouth daily.  . Omega 3 1200 MG CAPS Take 1 capsule by mouth daily.  . potassium chloride (MICRO-K) 10 MEQ CR capsule Take 1 capsule by mouth daily. Reported on 12/29/2015  . sildenafil (VIAGRA) 50 MG tablet Take 0.5 tablets (25 mg total) by mouth daily as needed for erectile dysfunction.  Marland Kitchen UNABLE TO FIND Med Name: CPAP @@ 4 cm H2O  . [DISCONTINUED] carvedilol (COREG) 25 MG tablet Take 1 tablet (25 mg total) by mouth 2 (two) times daily.  . [DISCONTINUED] furosemide (LASIX) 20 MG tablet  Take 1 tablet (20 mg total) by mouth daily.  . [DISCONTINUED] losartan (COZAAR) 100 MG tablet Take 1 tablet (100 mg total) by mouth daily.    PHQ 2/9 Scores 05/26/2020 10/28/2019 08/07/2018 10/30/2017  PHQ - 2 Score 0 0 0 0  PHQ- 9 Score 0 - - -    BP Readings from Last 3 Encounters:  05/26/20 140/88  10/28/19 132/82  11/11/18 132/82    Physical Exam Vitals and nursing note reviewed.  Constitutional:      General: He is not in acute distress.    Appearance: He is well-developed. He is obese.  HENT:     Head: Normocephalic and atraumatic.  Neck:     Vascular: No carotid bruit.  Cardiovascular:     Rate and Rhythm: Normal rate and regular rhythm.     Pulses: Normal pulses.  Pulmonary:     Effort: Pulmonary effort is normal. No respiratory distress.     Breath sounds: No wheezing or rhonchi.  Musculoskeletal:     Cervical back: Normal range of motion.     Right lower leg: No edema.     Left lower leg: No edema.  Lymphadenopathy:     Cervical: No cervical adenopathy.  Skin:    General: Skin is warm and dry.     Findings: No rash.  Neurological:     General: No focal deficit present.     Mental Status: He is alert and oriented to person, place, and time.  Psychiatric:        Mood and Affect: Mood normal.        Behavior: Behavior normal.     Wt Readings from Last 3 Encounters:  05/26/20 (!) 362 lb (164.2 kg)  10/28/19 (!) 372 lb (168.7 kg)  11/11/18 (!) 347 lb (157.4 kg)    BP 140/88 (BP Location: Right Arm, Patient Position: Sitting, Cuff Size: Large)   Pulse 91   Temp 98.1 F (36.7 C)   Ht 6' (1.829 m)   Wt (!) 362 lb (164.2 kg)   SpO2 95%   BMI 49.10 kg/m   Assessment and Plan: 1. Essential (primary) hypertension Clinically stable exam with fairly well controlled BP on four agents. Tolerating medications without side effects at this time. Pt to continue current regimen and low sodium diet; benefits of regular exercise as able discussed. - carvedilol  (COREG) 25 MG tablet; Take 1 tablet (25 mg total) by mouth 2 (two) times daily.  Dispense: 60 tablet; Refill: 5 - furosemide (LASIX) 20 MG tablet; Take 1 tablet (20 mg total) by mouth daily.  Dispense: 30 tablet; Refill: 5 - losartan (COZAAR) 100 MG tablet; Take 1 tablet (100 mg total) by mouth daily.  Dispense: 30 tablet; Refill: 5 - CBC with Differential/Platelet - Comprehensive metabolic panel - Lipid panel - TSH  2. BMI 50.0-59.9, adult (HCC) Continue work on exercise and dietary modifications  3. Obstructive sleep apnea syndrome On CPAP nightly   Partially dictated using Animal nutritionist. Any errors are unintentional.  Bari Edward, MD [facility]  05/26/2020

## 2020-05-27 LAB — CBC WITH DIFFERENTIAL/PLATELET
Basophils Absolute: 0 10*3/uL (ref 0.0–0.2)
Basos: 0 %
EOS (ABSOLUTE): 0.2 10*3/uL (ref 0.0–0.4)
Eos: 2 %
Hematocrit: 36.3 % — ABNORMAL LOW (ref 37.5–51.0)
Hemoglobin: 12 g/dL — ABNORMAL LOW (ref 13.0–17.7)
Immature Grans (Abs): 0 10*3/uL (ref 0.0–0.1)
Immature Granulocytes: 0 %
Lymphocytes Absolute: 2.3 10*3/uL (ref 0.7–3.1)
Lymphs: 20 %
MCH: 22 pg — ABNORMAL LOW (ref 26.6–33.0)
MCHC: 33.1 g/dL (ref 31.5–35.7)
MCV: 67 fL — ABNORMAL LOW (ref 79–97)
Monocytes Absolute: 0.6 10*3/uL (ref 0.1–0.9)
Monocytes: 5 %
Neutrophils Absolute: 8 10*3/uL — ABNORMAL HIGH (ref 1.4–7.0)
Neutrophils: 73 %
Platelets: 127 10*3/uL — ABNORMAL LOW (ref 150–450)
RBC: 5.46 x10E6/uL (ref 4.14–5.80)
RDW: 21 % — ABNORMAL HIGH (ref 11.6–15.4)
WBC: 11.1 10*3/uL — ABNORMAL HIGH (ref 3.4–10.8)

## 2020-05-27 LAB — COMPREHENSIVE METABOLIC PANEL
ALT: 15 IU/L (ref 0–44)
AST: 18 IU/L (ref 0–40)
Albumin/Globulin Ratio: 1.6 (ref 1.2–2.2)
Albumin: 4.6 g/dL (ref 3.8–4.9)
Alkaline Phosphatase: 77 IU/L (ref 48–121)
BUN/Creatinine Ratio: 13 (ref 9–20)
BUN: 27 mg/dL — ABNORMAL HIGH (ref 6–24)
Bilirubin Total: 1 mg/dL (ref 0.0–1.2)
CO2: 21 mmol/L (ref 20–29)
Calcium: 9.1 mg/dL (ref 8.7–10.2)
Chloride: 107 mmol/L — ABNORMAL HIGH (ref 96–106)
Creatinine, Ser: 2.04 mg/dL — ABNORMAL HIGH (ref 0.76–1.27)
GFR calc Af Amer: 41 mL/min/{1.73_m2} — ABNORMAL LOW (ref >59)
GFR calc non Af Amer: 36 mL/min/{1.73_m2} — ABNORMAL LOW (ref >59)
Globulin, Total: 2.8 g/dL (ref 1.5–4.5)
Glucose: 103 mg/dL — ABNORMAL HIGH (ref 65–99)
Potassium: 4.1 mmol/L (ref 3.5–5.2)
Sodium: 146 mmol/L — ABNORMAL HIGH (ref 134–144)
Total Protein: 7.4 g/dL (ref 6.0–8.5)

## 2020-05-27 LAB — LIPID PANEL
Chol/HDL Ratio: 4.5 ratio (ref 0.0–5.0)
Cholesterol, Total: 170 mg/dL (ref 100–199)
HDL: 38 mg/dL — ABNORMAL LOW (ref >39)
LDL Chol Calc (NIH): 101 mg/dL — ABNORMAL HIGH (ref 0–99)
Triglycerides: 177 mg/dL — ABNORMAL HIGH (ref 0–149)
VLDL Cholesterol Cal: 31 mg/dL (ref 5–40)

## 2020-05-27 LAB — TSH: TSH: 3.32 u[IU]/mL (ref 0.450–4.500)

## 2020-05-28 NOTE — Progress Notes (Signed)
Called pt with labs. Decreased kidney function.Take meds everyday to control BP.  If it decreases more will refer to Nephrology. Cholesterol, thyroid and blood counts are good. Pt verbalized understanding   KP

## 2020-07-22 ENCOUNTER — Other Ambulatory Visit: Payer: Self-pay | Admitting: Internal Medicine

## 2020-07-22 DIAGNOSIS — I10 Essential (primary) hypertension: Secondary | ICD-10-CM

## 2020-10-15 ENCOUNTER — Other Ambulatory Visit: Payer: Self-pay | Admitting: Internal Medicine

## 2020-10-15 DIAGNOSIS — N522 Drug-induced erectile dysfunction: Secondary | ICD-10-CM

## 2020-10-15 MED ORDER — SILDENAFIL CITRATE 50 MG PO TABS: 25.0000 mg | ORAL_TABLET | Freq: Every day | ORAL | 0 refills | Status: DC | PRN

## 2020-10-15 NOTE — Telephone Encounter (Signed)
Pt request refill  sildenafil (VIAGRA) 50 MG tablet  [facility] [address] ([address]), [place] - [address] Phone:  [phone]  Fax:  [phone]

## 2020-11-28 ENCOUNTER — Other Ambulatory Visit: Payer: Self-pay | Admitting: Internal Medicine

## 2020-11-28 DIAGNOSIS — I10 Essential (primary) hypertension: Secondary | ICD-10-CM

## 2020-11-28 NOTE — Telephone Encounter (Signed)
Requested Prescriptions  Pending Prescriptions Disp Refills   amLODipine (NORVASC) 10 MG tablet [Pharmacy Med Name: amLODIPine Besylate 10 MG Oral Tablet] 90 tablet 0    Sig: Take 1 tablet by mouth once daily     Cardiovascular:  Calcium Channel Blockers Failed - 11/28/2020  8:06 AM      Failed - Last BP in normal range    BP Readings from Last 1 Encounters:  05/26/20 140/88         Failed - Valid encounter within last 6 months    Recent Outpatient Visits          6 months ago Essential (primary) hypertension   [facility] Reubin Milan, MD   1 year ago Drug-induced erectile dysfunction   [facility] Reubin Milan, MD   2 years ago Rhinorrhea   [facility] Reubin Milan, MD   2 years ago Annual physical exam   [facility] Reubin Milan, MD   2 years ago Bronchitis   [facility] Reubin Milan, MD      Future Appointments            Tomorrow Reubin Milan, MD [facility]

## 2020-11-29 ENCOUNTER — Other Ambulatory Visit: Payer: Self-pay

## 2020-11-29 ENCOUNTER — Ambulatory Visit (INDEPENDENT_AMBULATORY_CARE_PROVIDER_SITE_OTHER): Payer: 59 | Admitting: Internal Medicine

## 2020-11-29 ENCOUNTER — Encounter: Payer: Self-pay | Admitting: Internal Medicine

## 2020-11-29 VITALS — BP 152/100 | HR 78 | Temp 98.3°F | Ht 72.0 in | Wt 359.0 lb

## 2020-11-29 DIAGNOSIS — N1832 Chronic kidney disease, stage 3b: Secondary | ICD-10-CM

## 2020-11-29 DIAGNOSIS — Z6841 Body Mass Index (BMI) 40.0 and over, adult: Secondary | ICD-10-CM

## 2020-11-29 DIAGNOSIS — Z23 Encounter for immunization: Secondary | ICD-10-CM | POA: Diagnosis not present

## 2020-11-29 DIAGNOSIS — Z0000 Encounter for general adult medical examination without abnormal findings: Secondary | ICD-10-CM | POA: Diagnosis not present

## 2020-11-29 DIAGNOSIS — I10 Essential (primary) hypertension: Secondary | ICD-10-CM | POA: Diagnosis not present

## 2020-11-29 DIAGNOSIS — N1831 Chronic kidney disease, stage 3a: Secondary | ICD-10-CM | POA: Insufficient documentation

## 2020-11-29 DIAGNOSIS — G4733 Obstructive sleep apnea (adult) (pediatric): Secondary | ICD-10-CM

## 2020-11-29 DIAGNOSIS — Z125 Encounter for screening for malignant neoplasm of prostate: Secondary | ICD-10-CM

## 2020-11-29 DIAGNOSIS — Z1211 Encounter for screening for malignant neoplasm of colon: Secondary | ICD-10-CM

## 2020-11-29 LAB — POCT URINALYSIS DIPSTICK
Bilirubin, UA: NEGATIVE
Blood, UA: NEGATIVE
Glucose, UA: NEGATIVE
Ketones, UA: NEGATIVE
Leukocytes, UA: NEGATIVE
Nitrite, UA: NEGATIVE
Protein, UA: NEGATIVE
Spec Grav, UA: 1.015 (ref 1.010–1.025)
Urobilinogen, UA: 0.2 E.U./dL
pH, UA: 5 (ref 5.0–8.0)

## 2020-11-29 MED ORDER — FUROSEMIDE 20 MG PO TABS: 20.0000 mg | ORAL_TABLET | Freq: Every day | ORAL | 5 refills | Status: DC

## 2020-11-29 MED ORDER — LOSARTAN POTASSIUM 100 MG PO TABS: 100.0000 mg | ORAL_TABLET | Freq: Every day | ORAL | 5 refills | Status: DC

## 2020-11-29 MED ORDER — CARVEDILOL 25 MG PO TABS: 25.0000 mg | ORAL_TABLET | Freq: Two times a day (BID) | ORAL | 5 refills | Status: DC

## 2020-11-29 NOTE — Progress Notes (Signed)
Date:  11/29/2020   Name:  Bradley Love   DOB:  12-Jan-1966   MRN:  [id number]   Chief Complaint: Annual Exam (First shingrex ), Hypertension, and Flu Vaccine  Bradley Love is a 54 y.o. male who presents today for his Complete Annual Exam. He feels well. He reports exercising gym X3 times a week. He reports he is sleeping well.   Colonoscopy: due   Immunization History  Administered Date(s) Administered  . HiB (PRP-T) 04/03/2010  . Influenza,inj,Quad PF,6+ Mos 09/13/2016, 10/28/2019  . Influenza-Unspecified 12/17/2015, 10/15/2017, 10/08/2018, 12/31/2018  . Meningococcal Conjugate 04/04/2010, 03/03/2019  . Moderna Sars-Covid-2 Vaccination 04/21/2020, 05/22/2020  . Pneumococcal Conjugate-13 11/11/2018  . Pneumococcal Polysaccharide-23 04/03/2010  . Tdap 12/20/2003    Hypertension This is a chronic problem. The problem is resistant. Pertinent negatives include no chest pain, headaches, palpitations or shortness of breath. Risk factors for coronary artery disease include obesity. Past treatments include beta blockers, calcium channel blockers, diuretics and angiotensin blockers. The current treatment provides significant improvement. There are no compliance problems.  Hypertensive end-organ damage includes kidney disease.  OSA - on CPAP nightly.  Sleeps well, wakes refreshed, minimal daytime sleepiness. Obesity - being seen earlier this year in the [facility] Bariat[facility] clinic but stopped due to non coverage.  He is Solicitor and hopes to return.  Lab Results  Component Value Date   CREATININE 2.04 (H) 05/26/2020   BUN 27 (H) 05/26/2020   NA 146 (H) 05/26/2020   K 4.1 05/26/2020   CL 107 (H) 05/26/2020   CO2 21 05/26/2020   Lab Results  Component Value Date   CHOL 170 05/26/2020   HDL 38 (L) 05/26/2020   LDLCALC 101 (H) 05/26/2020   TRIG 177 (H) 05/26/2020   CHOLHDL 4.5 05/26/2020   Lab Results  Component Value Date   TSH 3.320 05/26/2020   No results  found for: HGBA1C Lab Results  Component Value Date   WBC 11.1 (H) 05/26/2020   HGB 12.0 (L) 05/26/2020   HCT 36.3 (L) 05/26/2020   MCV 67 (L) 05/26/2020   PLT 127 (L) 05/26/2020   Lab Results  Component Value Date   ALT 15 05/26/2020   AST 18 05/26/2020   ALKPHOS 77 05/26/2020   BILITOT 1.0 05/26/2020     Review of Systems  Constitutional: Negative for appetite change, chills, diaphoresis, fatigue and unexpected weight change.  HENT: Negative for hearing loss, tinnitus, trouble swallowing and voice change.   Eyes: Negative for visual disturbance.  Respiratory: Negative for choking, shortness of breath and wheezing.   Cardiovascular: Negative for chest pain, palpitations and leg swelling.  Gastrointestinal: Negative for abdominal pain, blood in stool, constipation and diarrhea.  Endocrine: Negative for polydipsia and polyuria.  Genitourinary: Negative for difficulty urinating, dysuria and frequency.  Musculoskeletal: Negative for arthralgias, back pain and myalgias.  Skin: Negative for color change and rash.  Neurological: Negative for dizziness, focal weakness, syncope and headaches.  Hematological: Negative for adenopathy.  Psychiatric/Behavioral: Negative for dysphoric mood and sleep disturbance.    Patient Active Problem List   Diagnosis Date Noted  . Chronic renal failure, stage 3b (HCC) 11/29/2020  . Rhinorrhea 10/28/2019  . Obstructive sleep apnea syndrome 02/07/2019  . Family history of sickle cell trait 10/31/2017  . Morbid obesity with BMI of 45.0-49.9, adult (HCC) 09/13/2016  . Absolute anemia 09/13/2016  . Essential (primary) hypertension 07/27/2015  . Encounter for screening for malignant neoplasm of prostate 07/27/2015    No  Known Allergies  Past Surgical History:  Procedure Laterality Date  . CHOLECYSTECTOMY  2002  . CORNEAL TRANSPLANT Right 2010    Social History   Tobacco Use  . Smoking status: Former Smoker    Types: Cigarettes    Quit  date: 1997    Years since quitting: 24.9  . Smokeless tobacco: Never Used  Vaping Use  . Vaping Use: Never used  Substance Use Topics  . Alcohol use: Yes    Alcohol/week: 0.0 standard drinks    Comment: occasionally -3-4x/yr  . Drug use: No     Medication list has been reviewed and updated.  Current Meds  Medication Sig  . albuterol (VENTOLIN HFA) 108 (90 Base) MCG/ACT inhaler Inhale 2 puffs into the lungs 4 (four) times daily as needed.  Marland Kitchen amLODipine (NORVASC) 10 MG tablet Take 1 tablet by mouth once daily  . aspirin 81 MG tablet Take 1 tablet by mouth daily.  Marland Kitchen azelastine (ASTELIN) 0.1 % nasal spray Place 1 spray into both nostrils 2 (two) times daily. Use in each nostril as directed (Patient taking differently: Place 1 spray into both nostrils as needed. Use in each nostril as directed)  . carvedilol (COREG) 25 MG tablet Take 1 tablet (25 mg total) by mouth 2 (two) times daily.  Marland Kitchen desloratadine (CLARINEX) 5 MG tablet Take 5 mg by mouth daily as needed.   . furosemide (LASIX) 20 MG tablet Take 1 tablet (20 mg total) by mouth daily.  Marland Kitchen losartan (COZAAR) 100 MG tablet Take 1 tablet (100 mg total) by mouth daily.  . Multiple Vitamins-Minerals (MULTIVITAL PO) Take by mouth daily.  . Omega 3 1200 MG CAPS Take 1 capsule by mouth daily.  . potassium chloride (MICRO-K) 10 MEQ CR capsule Take 1 capsule by mouth daily. Reported on 12/29/2015  . sildenafil (VIAGRA) 50 MG tablet Take 0.5 tablets (25 mg total) by mouth daily as needed for erectile dysfunction.  Marland Kitchen UNABLE TO FIND Med Name: CPAP @@ 4 cm H2O    PHQ 2/9 Scores 11/29/2020 05/26/2020 10/28/2019 08/07/2018  PHQ - 2 Score 0 0 0 0  PHQ- 9 Score 0 0 - -    GAD 7 : Generalized Anxiety Score 11/29/2020 05/26/2020  Nervous, Anxious, on Edge 0 0  Control/stop worrying 0 0  Worry too much - different things 0 0  Trouble relaxing 0 0  Restless 0 0  Easily annoyed or irritable 0 0  Afraid - awful might happen 0 0  Total GAD 7 Score 0 0   Anxiety Difficulty - Not difficult at all    BP Readings from Last 3 Encounters:  11/29/20 (!) 152/100  05/26/20 140/88  10/28/19 132/82    Physical Exam Vitals and nursing note reviewed.  Constitutional:      Appearance: Normal appearance. He is well-developed. He is obese.  HENT:     Head: Normocephalic.     Right Ear: Tympanic membrane, ear canal and external ear normal.     Left Ear: Tympanic membrane, ear canal and external ear normal.     Nose: Nose normal.  Eyes:     Conjunctiva/sclera: Conjunctivae normal.     Pupils: Pupils are equal, round, and reactive to light.  Neck:     Thyroid: No thyromegaly.     Vascular: No carotid bruit.  Cardiovascular:     Rate and Rhythm: Normal rate and regular rhythm.     Pulses: Normal pulses.     Heart sounds: Normal heart sounds.  Pulmonary:     Effort: Pulmonary effort is normal.     Breath sounds: Normal breath sounds. No wheezing.  Chest:  Breasts:     Right: No mass.     Left: No mass.    Abdominal:     General: Bowel sounds are normal.     Palpations: Abdomen is soft.     Tenderness: There is no abdominal tenderness.     Hernia: No hernia is present.  Musculoskeletal:        General: Normal range of motion.     Cervical back: Normal range of motion and neck supple.     Right lower leg: No edema.     Left lower leg: No edema.  Lymphadenopathy:     Cervical: No cervical adenopathy.  Skin:    General: Skin is warm and dry.     Findings: Lesion present.          Comments: 1.5 cm raised soft smooth cystic lesion  Neurological:     General: No focal deficit present.     Mental Status: He is alert and oriented to person, place, and time.     Deep Tendon Reflexes: Reflexes are normal and symmetric.  Psychiatric:        Attention and Perception: Attention normal.        Mood and Affect: Mood normal.        Behavior: Behavior normal.     Wt Readings from Last 3 Encounters:  11/29/20 (!) 359 lb (162.8 kg)   05/26/20 (!) 362 lb (164.2 kg)  10/28/19 (!) 372 lb (168.7 kg)    BP (!) 152/100   Pulse 78   Temp 98.3 F (36.8 C) (Oral)   Ht 6' (1.829 m)   Wt (!) 359 lb (162.8 kg)   SpO2 95%   BMI 48.69 kg/m   Assessment and Plan: 1. Annual physical exam Exam is normal except for weight. Encourage regular exercise and appropriate dietary changes. He is anticipating bariatric surgery in the near future. - Lipid panel - POCT urinalysis dipstick  2. Colon cancer screening - Ambulatory referral to Gastroenterology  3. Encounter for screening for malignant neoplasm of prostate - PSA  4. Essential (primary) hypertension Clinically stable exam.  BP high today due to being out of 2 medications. I encourage him to get a cuff and monitor at home. Tolerating medications without side effects at this time. Pt to continue current regimen and low sodium diet; benefits of regular exercise as able discussed. - CBC with Differential/Platelet - TSH - furosemide (LASIX) 20 MG tablet; Take 1 tablet (20 mg total) by mouth daily.  Dispense: 30 tablet; Refill: 5 - losartan (COZAAR) 100 MG tablet; Take 1 tablet (100 mg total) by mouth daily.  Dispense: 30 tablet; Refill: 5 - carvedilol (COREG) 25 MG tablet; Take 1 tablet (25 mg total) by mouth 2 (two) times daily.  Dispense: 60 tablet; Refill: 5  5. Chronic renal failure, stage 3b (HCC) - Comprehensive metabolic panel  6. Morbid obesity with BMI of 45.0-49.9, adult [facility]) He plans to follow up with Bariatric after the first of the year.  7. Obstructive sleep apnea syndrome Doing well on CPAP nightly   Partially dictated using Animal nutritionist. Any errors are unintentional.  Bari Edward, MD [facility]  11/29/2020

## 2020-11-30 LAB — CBC WITH DIFFERENTIAL/PLATELET
Basophils Absolute: 0 10*3/uL (ref 0.0–0.2)
Basos: 0 %
EOS (ABSOLUTE): 0.2 10*3/uL (ref 0.0–0.4)
Eos: 1 %
Hematocrit: 38.2 % (ref 37.5–51.0)
Hemoglobin: 12.6 g/dL — ABNORMAL LOW (ref 13.0–17.7)
Immature Grans (Abs): 0 10*3/uL (ref 0.0–0.1)
Immature Granulocytes: 0 %
Lymphocytes Absolute: 1.9 10*3/uL (ref 0.7–3.1)
Lymphs: 17 %
MCH: 22.1 pg — ABNORMAL LOW (ref 26.6–33.0)
MCHC: 33 g/dL (ref 31.5–35.7)
MCV: 67 fL — ABNORMAL LOW (ref 79–97)
Monocytes Absolute: 0.4 10*3/uL (ref 0.1–0.9)
Monocytes: 4 %
Neutrophils Absolute: 8.9 10*3/uL — ABNORMAL HIGH (ref 1.4–7.0)
Neutrophils: 78 %
Platelets: 123 10*3/uL — ABNORMAL LOW (ref 150–450)
RBC: 5.7 x10E6/uL (ref 4.14–5.80)
RDW: 21.6 % — ABNORMAL HIGH (ref 11.6–15.4)
WBC: 11.5 10*3/uL — ABNORMAL HIGH (ref 3.4–10.8)

## 2020-11-30 LAB — COMPREHENSIVE METABOLIC PANEL
ALT: 11 IU/L (ref 0–44)
AST: 12 IU/L (ref 0–40)
Albumin/Globulin Ratio: 2 (ref 1.2–2.2)
Albumin: 4.9 g/dL (ref 3.8–4.9)
Alkaline Phosphatase: 86 IU/L (ref 44–121)
BUN/Creatinine Ratio: 11 (ref 9–20)
BUN: 15 mg/dL (ref 6–24)
Bilirubin Total: 1.3 mg/dL — ABNORMAL HIGH (ref 0.0–1.2)
CO2: 25 mmol/L (ref 20–29)
Calcium: 9.5 mg/dL (ref 8.7–10.2)
Chloride: 101 mmol/L (ref 96–106)
Creatinine, Ser: 1.34 mg/dL — ABNORMAL HIGH (ref 0.76–1.27)
GFR calc Af Amer: 68 mL/min/{1.73_m2} (ref >59)
GFR calc non Af Amer: 59 mL/min/{1.73_m2} — ABNORMAL LOW (ref >59)
Globulin, Total: 2.4 g/dL (ref 1.5–4.5)
Glucose: 113 mg/dL — ABNORMAL HIGH (ref 65–99)
Potassium: 4.1 mmol/L (ref 3.5–5.2)
Sodium: 142 mmol/L (ref 134–144)
Total Protein: 7.3 g/dL (ref 6.0–8.5)

## 2020-11-30 LAB — LIPID PANEL
Chol/HDL Ratio: 4.3 ratio (ref 0.0–5.0)
Cholesterol, Total: 182 mg/dL (ref 100–199)
HDL: 42 mg/dL (ref >39)
LDL Chol Calc (NIH): 121 mg/dL — ABNORMAL HIGH (ref 0–99)
Triglycerides: 105 mg/dL (ref 0–149)
VLDL Cholesterol Cal: 19 mg/dL (ref 5–40)

## 2020-11-30 LAB — PSA: Prostate Specific Ag, Serum: 0.8 ng/mL (ref 0.0–4.0)

## 2020-11-30 LAB — TSH: TSH: 2.75 u[IU]/mL (ref 0.450–4.500)

## 2020-12-01 ENCOUNTER — Other Ambulatory Visit: Payer: Self-pay

## 2020-12-01 ENCOUNTER — Ambulatory Visit (INDEPENDENT_AMBULATORY_CARE_PROVIDER_SITE_OTHER): Payer: 59

## 2020-12-01 ENCOUNTER — Telehealth: Payer: Self-pay

## 2020-12-01 DIAGNOSIS — Z23 Encounter for immunization: Secondary | ICD-10-CM

## 2020-12-01 NOTE — Progress Notes (Signed)
Gastroenterology Pre-Procedure Review  Request Date: 12/27/20 Requesting Physician: Dr. Allegra Lai  PATIENT REVIEW QUESTIONS: The patient responded to the following health history questions as indicated:    1. Are you having any GI issues? no 2. Do you have a personal history of Polyps? no 3. Do you have a family history of Colon Cancer or Polyps? no 4. Diabetes Mellitus? no 5. Joint replacements in the past 12 months?no 6. Major health problems in the past 3 months?no 7. Any artificial heart valves, MVP, or defibrillator?no    MEDICATIONS & ALLERGIES:    Patient reports the following regarding taking any anticoagulation/antiplatelet therapy:   Plavix, Coumadin, Eliquis, Xarelto, Lovenox, Pradaxa, Brilinta, or Effient? no Aspirin? no  Patient confirms/reports the following medications:  Current Outpatient Medications  Medication Sig Dispense Refill  . albuterol (VENTOLIN HFA) 108 (90 Base) MCG/ACT inhaler Inhale 2 puffs into the lungs 4 (four) times daily as needed.    Marland Kitchen amLODipine (NORVASC) 10 MG tablet Take 1 tablet by mouth once daily 90 tablet 0  . aspirin 81 MG tablet Take 1 tablet by mouth daily.    Marland Kitchen azelastine (ASTELIN) 0.1 % nasal spray Place 1 spray into both nostrils 2 (two) times daily. Use in each nostril as directed (Patient taking differently: Place 1 spray into both nostrils as needed. Use in each nostril as directed) 30 mL 1  . carvedilol (COREG) 25 MG tablet Take 1 tablet (25 mg total) by mouth 2 (two) times daily. 60 tablet 5  . desloratadine (CLARINEX) 5 MG tablet Take 5 mg by mouth daily as needed.     . furosemide (LASIX) 20 MG tablet Take 1 tablet (20 mg total) by mouth daily. 30 tablet 5  . losartan (COZAAR) 100 MG tablet Take 1 tablet (100 mg total) by mouth daily. 30 tablet 5  . Multiple Vitamins-Minerals (MULTIVITAL PO) Take by mouth daily.    . Omega 3 1200 MG CAPS Take 1 capsule by mouth daily.    . potassium chloride (MICRO-K) 10 MEQ CR capsule Take 1 capsule  by mouth daily. Reported on 12/29/2015    . sildenafil (VIAGRA) 50 MG tablet Take 0.5 tablets (25 mg total) by mouth daily as needed for erectile dysfunction. 10 tablet 0  . UNABLE TO FIND Med Name: CPAP @@ 4 cm H2O     No current facility-administered medications for this visit.    Patient confirms/reports the following allergies:  No Known Allergies  Orders Placed This Encounter  Procedures  . Procedural/ Surgical Case Request: COLONOSCOPY WITH PROPOFOL    Standing Status:   Standing    Number of Occurrences:   1    Order Specific Question:   Pre-op diagnosis    Answer:   screening colonoscopy    Order Specific Question:   CPT Code    Answer:   85462    AUTHORIZATION INFORMATION Primary Insurance: 1D#: Group #:  Secondary Insurance: 1D#: Group #:  SCHEDULE INFORMATION: Date: 12/27/20 Time: Location:[facility]

## 2020-12-01 NOTE — Telephone Encounter (Signed)
Pt. Given lab results and instructions, verbalizes understanding. 

## 2020-12-02 ENCOUNTER — Telehealth (INDEPENDENT_AMBULATORY_CARE_PROVIDER_SITE_OTHER): Payer: Self-pay | Admitting: Gastroenterology

## 2020-12-02 DIAGNOSIS — Z1211 Encounter for screening for malignant neoplasm of colon: Secondary | ICD-10-CM

## 2020-12-02 MED ORDER — NA SULFATE-K SULFATE-MG SULF 17.5-3.13-1.6 GM/177ML PO SOLN: 1.0000 | Freq: Once | ORAL | 0 refills | Status: AC

## 2020-12-23 ENCOUNTER — Other Ambulatory Visit: Admission: RE | Admit: 2020-12-23 | Payer: 59 | Source: Ambulatory Visit

## 2020-12-27 ENCOUNTER — Ambulatory Visit: Admission: RE | Admit: 2020-12-27 | Payer: 59 | Source: Home / Self Care | Admitting: Gastroenterology

## 2020-12-27 ENCOUNTER — Encounter: Admission: RE | Payer: Self-pay | Source: Home / Self Care

## 2020-12-27 ENCOUNTER — Telehealth: Payer: Self-pay

## 2020-12-27 SURGERY — COLONOSCOPY WITH PROPOFOL
Anesthesia: General

## 2020-12-27 NOTE — Telephone Encounter (Signed)
Patient did not go for covid test so procedure for 12/27/2020 is canceled. Called and left a detail message to give [place] a call back to rescheduled procedure  Sent mychart message

## 2021-01-10 ENCOUNTER — Telehealth: Payer: Self-pay

## 2021-01-10 NOTE — Telephone Encounter (Unsigned)
Copied from CRM [id number]. Topic: Quick Communication - See Telephone Encounter >> Jan 10, 2021  8:30 AM Aretta Nip wrote: CRM for notification. See Telephone encounter for: 01/10/21.Pls call pt back, wanted to see Dr B this am as sent home from work with a cough and bronchitis, stuffy nose, no fever. States negative covid test, wants in office appt if possible states has this every year. Pt failed PEC DT Please return call [phone]

## 2021-01-10 NOTE — Telephone Encounter (Signed)
Spoke with patient. He said he had a negative COVID test at work yesterday. Was sent home due to cough. No fever. Clear production. Said he does get bronchitis yearly. Scheduled appt with Dr Yetta Barre in the morning. Said he will be here for appt. Patient is seeing Dr. Yetta Barre because Dr. Judithann Graves is out of the office this week.

## 2021-01-11 ENCOUNTER — Ambulatory Visit: Payer: 59 | Admitting: Family Medicine

## 2021-01-11 ENCOUNTER — Other Ambulatory Visit: Payer: Self-pay

## 2021-01-11 ENCOUNTER — Encounter: Payer: Self-pay | Admitting: Family Medicine

## 2021-01-11 VITALS — BP 134/82 | HR 81 | Temp 98.0°F | Ht 72.0 in | Wt 362.0 lb

## 2021-01-11 DIAGNOSIS — J019 Acute sinusitis, unspecified: Secondary | ICD-10-CM | POA: Diagnosis not present

## 2021-01-11 DIAGNOSIS — R059 Cough, unspecified: Secondary | ICD-10-CM | POA: Diagnosis not present

## 2021-01-11 MED ORDER — ALBUTEROL SULFATE HFA 108 (90 BASE) MCG/ACT IN AERS: 2.0000 | INHALATION_SPRAY | Freq: Four times a day (QID) | RESPIRATORY_TRACT | 2 refills | Status: DC | PRN

## 2021-01-11 MED ORDER — PREDNISONE 10 MG PO TABS: 10.0000 mg | ORAL_TABLET | Freq: Every day | ORAL | 0 refills | Status: DC

## 2021-01-11 MED ORDER — AZITHROMYCIN 250 MG PO TABS: ORAL_TABLET | ORAL | 0 refills | Status: DC

## 2021-01-11 MED ORDER — GUAIFENESIN-CODEINE 100-10 MG/5ML PO SYRP: 5.0000 mL | ORAL_SOLUTION | Freq: Four times a day (QID) | ORAL | 0 refills | Status: DC | PRN

## 2021-01-11 NOTE — Progress Notes (Signed)
Date:  01/11/2021   Name:  Bradley Love   DOB:  [birth year]   MRN:  [id number]   Chief Complaint: Sinusitis (Clear production from cough- started Sunday. No fever or SOB)  Sinusitis This is a new problem. The current episode started in the past 7 days. The problem has been waxing and waning since onset. There has been no fever. Associated symptoms include congestion and coughing. Pertinent negatives include no diaphoresis, ear pain, hoarse voice, shortness of breath, sinus pressure, sneezing, sore throat or swollen glands. Treatments tried: albuterol. The treatment provided mild relief.    Lab Results  Component Value Date   CREATININE 1.34 (H) 11/29/2020   BUN 15 11/29/2020   NA 142 11/29/2020   K 4.1 11/29/2020   CL 101 11/29/2020   CO2 25 11/29/2020   Lab Results  Component Value Date   CHOL 182 11/29/2020   HDL 42 11/29/2020   LDLCALC 121 (H) 11/29/2020   TRIG 105 11/29/2020   CHOLHDL 4.3 11/29/2020   Lab Results  Component Value Date   TSH 2.750 11/29/2020   No results found for: HGBA1C Lab Results  Component Value Date   WBC 11.5 (H) 11/29/2020   HGB 12.6 (L) 11/29/2020   HCT 38.2 11/29/2020   MCV 67 (L) 11/29/2020   PLT 123 (L) 11/29/2020   Lab Results  Component Value Date   ALT 11 11/29/2020   AST 12 11/29/2020   ALKPHOS 86 11/29/2020   BILITOT 1.3 (H) 11/29/2020     Review of Systems  Constitutional: Negative for diaphoresis.  HENT: Positive for congestion. Negative for ear pain, hoarse voice, sinus pressure, sneezing and sore throat.   Respiratory: Positive for cough. Negative for shortness of breath.     Patient Active Problem List   Diagnosis Date Noted  . Chronic renal failure, stage 3b (HCC) 11/29/2020  . Rhinorrhea 10/28/2019  . Obstructive sleep apnea syndrome 02/07/2019  . Family history of sickle cell trait 10/31/2017  . Morbid obesity with BMI of 45.0-49.9, adult (HCC) 09/13/2016  . Absolute anemia 09/13/2016  . Essential  (primary) hypertension 07/27/2015  . Encounter for screening for malignant neoplasm of prostate 07/27/2015    No Known Allergies  Past Surgical History:  Procedure Laterality Date  . CHOLECYSTECTOMY  2002  . CORNEAL TRANSPLANT Right 2010    Social History   Tobacco Use  . Smoking status: Former Smoker    Types: Cigarettes    Quit date: 1997    Years since quitting: 25.0  . Smokeless tobacco: Never Used  Vaping Use  . Vaping Use: Never used  Substance Use Topics  . Alcohol use: Yes    Alcohol/week: 0.0 standard drinks    Comment: occasionally -3-4x/yr  . Drug use: No     Medication list has been reviewed and updated.  Current Meds  Medication Sig  . albuterol (VENTOLIN HFA) 108 (90 Base) MCG/ACT inhaler Inhale 2 puffs into the lungs 4 (four) times daily as needed.  Marland Kitchen amLODipine (NORVASC) 10 MG tablet Take 1 tablet by mouth once daily  . aspirin 81 MG tablet Take 1 tablet by mouth daily.  Marland Kitchen azelastine (ASTELIN) 0.1 % nasal spray Place 1 spray into both nostrils 2 (two) times daily. Use in each nostril as directed (Patient taking differently: Place 1 spray into both nostrils as needed. Use in each nostril as directed)  . carvedilol (COREG) 25 MG tablet Take 1 tablet (25 mg total) by mouth 2 (two) times daily.  Marland Kitchen  desloratadine (CLARINEX) 5 MG tablet Take 5 mg by mouth daily as needed.   . furosemide (LASIX) 20 MG tablet Take 1 tablet (20 mg total) by mouth daily.  Marland Kitchen losartan (COZAAR) 100 MG tablet Take 1 tablet (100 mg total) by mouth daily.  . Multiple Vitamins-Minerals (MULTIVITAL PO) Take by mouth daily.  . Omega 3 1200 MG CAPS Take 1 capsule by mouth daily.  . potassium chloride (MICRO-K) 10 MEQ CR capsule Take 1 capsule by mouth daily. Reported on 12/29/2015  . sildenafil (VIAGRA) 50 MG tablet Take 0.5 tablets (25 mg total) by mouth daily as needed for erectile dysfunction.  Marland Kitchen UNABLE TO FIND Med Name: CPAP @@ 4 cm H2O    PHQ 2/9 Scores 11/29/2020 05/26/2020 10/28/2019  08/07/2018  PHQ - 2 Score 0 0 0 0  PHQ- 9 Score 0 0 - -    GAD 7 : Generalized Anxiety Score 11/29/2020 05/26/2020  Nervous, Anxious, on Edge 0 0  Control/stop worrying 0 0  Worry too much - different things 0 0  Trouble relaxing 0 0  Restless 0 0  Easily annoyed or irritable 0 0  Afraid - awful might happen 0 0  Total GAD 7 Score 0 0  Anxiety Difficulty - Not difficult at all    BP Readings from Last 3 Encounters:  01/11/21 134/82  11/29/20 (!) 152/100  05/26/20 140/88    Physical Exam Vitals and nursing note reviewed.  Constitutional:      Appearance: He is well-developed.  HENT:     Head: Normocephalic and atraumatic.     Right Ear: Tympanic membrane, ear canal and external ear normal.     Left Ear: Tympanic membrane, ear canal and external ear normal.     Nose: Nose normal.     Mouth/Throat:     Dentition: Normal dentition.  Eyes:     General: Lids are normal. No scleral icterus.    Conjunctiva/sclera: Conjunctivae normal.     Pupils: Pupils are equal, round, and reactive to light.  Neck:     Thyroid: No thyromegaly.     Vascular: No carotid bruit, hepatojugular reflux or JVD.     Trachea: No tracheal deviation.  Cardiovascular:     Rate and Rhythm: Normal rate and regular rhythm.     Heart sounds: Normal heart sounds.  Pulmonary:     Effort: Pulmonary effort is normal.     Breath sounds: Normal breath sounds. No decreased breath sounds, wheezing, rhonchi or rales.  Abdominal:     General: Bowel sounds are normal.     Palpations: Abdomen is soft. There is no hepatomegaly, splenomegaly or mass.     Tenderness: There is no abdominal tenderness.     Hernia: There is no hernia in the left inguinal area.  Genitourinary:    Testes: Normal.     Prostate: Normal.     Rectum: Normal.  Musculoskeletal:        General: Normal range of motion.     Cervical back: Normal range of motion and neck supple.  Lymphadenopathy:     Cervical: No cervical adenopathy.  Skin:     General: Skin is warm and dry.     Findings: No rash.  Neurological:     Mental Status: He is alert and oriented to person, place, and time.     Sensory: No sensory deficit.     Deep Tendon Reflexes: Reflexes are normal and symmetric.  Psychiatric:  Mood and Affect: Mood is not anxious or depressed.     Wt Readings from Last 3 Encounters:  01/11/21 (!) 362 lb (164.2 kg)  11/29/20 (!) 359 lb (162.8 kg)  05/26/20 (!) 362 lb (164.2 kg)    BP 134/82   Pulse 81   Temp 98 F (36.7 C) (Oral)   Ht 6' (1.829 m)   Wt (!) 362 lb (164.2 kg)   SpO2 96%   BMI 49.10 kg/m   Assessment and Plan: 1. Acute non-recurrent sinusitis, unspecified location New onset.  Persistent.  Stable.  Patient has exam and history consistent with acute sinusitis and we will initiate a azithromycin to 50 mg 2 today followed by 1 a day for 4 days. - azithromycin (ZITHROMAX) 250 MG tablet; 2 today then 1 a day for 4 day  Dispense: 6 tablet; Refill: 0  2. Cough Patient's had a persistent cough associated with this and likely has a bronchial component to his upper respiratory illness.  We will refill his albuterol 2 puffs 4 times a day as needed along with prednisone 10 mg once a day and guaifenesin codeine AC 1 teaspoon every 6 hours as needed cough. - albuterol (VENTOLIN HFA) 108 (90 Base) MCG/ACT inhaler; Inhale 2 puffs into the lungs 4 (four) times daily as needed.  Dispense: 18 g; Refill: 2 - predniSONE (DELTASONE) 10 MG tablet; Take 1 tablet (10 mg total) by mouth daily with breakfast.  Dispense: 30 tablet; Refill: 0 - guaiFENesin-codeine (ROBITUSSIN AC) 100-10 MG/5ML syrup; Take 5 mLs by mouth 4 (four) times daily as needed for cough.  Dispense: 118 mL; Refill: 0

## 2021-03-01 ENCOUNTER — Ambulatory Visit: Payer: 59

## 2021-03-09 ENCOUNTER — Other Ambulatory Visit: Payer: Self-pay | Admitting: Internal Medicine

## 2021-03-09 DIAGNOSIS — I10 Essential (primary) hypertension: Secondary | ICD-10-CM

## 2021-03-10 ENCOUNTER — Ambulatory Visit (INDEPENDENT_AMBULATORY_CARE_PROVIDER_SITE_OTHER): Payer: 59

## 2021-03-10 ENCOUNTER — Ambulatory Visit: Payer: 59

## 2021-03-10 ENCOUNTER — Other Ambulatory Visit: Payer: Self-pay

## 2021-03-10 VITALS — Ht 72.0 in

## 2021-03-10 DIAGNOSIS — Z23 Encounter for immunization: Secondary | ICD-10-CM | POA: Diagnosis not present

## 2021-03-17 DIAGNOSIS — G9600 Cerebrospinal fluid leak, unspecified: Secondary | ICD-10-CM | POA: Insufficient documentation

## 2021-03-18 HISTORY — PX: SINUS EXPLORATION: SHX5214

## 2021-06-01 ENCOUNTER — Encounter: Payer: Self-pay | Admitting: Internal Medicine

## 2021-06-01 ENCOUNTER — Ambulatory Visit: Payer: 59 | Admitting: Internal Medicine

## 2021-06-14 ENCOUNTER — Ambulatory Visit: Payer: 59 | Admitting: Internal Medicine

## 2021-06-23 ENCOUNTER — Encounter: Payer: Self-pay | Admitting: Internal Medicine

## 2021-06-23 ENCOUNTER — Ambulatory Visit: Payer: 59 | Admitting: Internal Medicine

## 2021-06-23 NOTE — Progress Notes (Deleted)
Date:  06/23/2021   Name:  Bradley Love   DOB:  1966/06/22   MRN:  [id number]   Chief Complaint: No chief complaint on file.  Hypertension This is a chronic problem. The problem is controlled. Past treatments include beta blockers, calcium channel blockers, diuretics and angiotensin blockers. The current treatment provides significant improvement.   Lab Results  Component Value Date   CREATININE 1.34 (H) 11/29/2020   BUN 15 11/29/2020   NA 142 11/29/2020   K 4.1 11/29/2020   CL 101 11/29/2020   CO2 25 11/29/2020   Lab Results  Component Value Date   CHOL 182 11/29/2020   HDL 42 11/29/2020   LDLCALC 121 (H) 11/29/2020   TRIG 105 11/29/2020   CHOLHDL 4.3 11/29/2020   Lab Results  Component Value Date   TSH 2.750 11/29/2020   No results found for: HGBA1C Lab Results  Component Value Date   WBC 11.5 (H) 11/29/2020   HGB 12.6 (L) 11/29/2020   HCT 38.2 11/29/2020   MCV 67 (L) 11/29/2020   PLT 123 (L) 11/29/2020   Lab Results  Component Value Date   ALT 11 11/29/2020   AST 12 11/29/2020   ALKPHOS 86 11/29/2020   BILITOT 1.3 (H) 11/29/2020     Review of Systems  Patient Active Problem List   Diagnosis Date Noted   CSF leak 03/17/2021   Chronic renal failure, stage 3b (HCC) 11/29/2020   Rhinorrhea 10/28/2019   Obstructive sleep apnea syndrome 02/07/2019   Family history of sickle cell trait 10/31/2017   Morbid obesity with BMI of 45.0-49.9, adult (HCC) 09/13/2016   Absolute anemia 09/13/2016   Essential (primary) hypertension 07/27/2015   Encounter for screening for malignant neoplasm of prostate 07/27/2015    No Known Allergies  Past Surgical History:  Procedure Laterality Date   CHOLECYSTECTOMY  2002   CORNEAL TRANSPLANT Right 2010    Social History   Tobacco Use   Smoking status: Former    Pack years: 0.00    Types: Cigarettes    Quit date: 1997    Years since quitting: 25.5   Smokeless tobacco: Never  Vaping Use   Vaping Use: Never  used  Substance Use Topics   Alcohol use: Yes    Alcohol/week: 0.0 standard drinks    Comment: occasionally -3-4x/yr   Drug use: No     Medication list has been reviewed and updated.  No outpatient medications have been marked as taking for the 06/23/21 encounter (Appointment) with Reubin Milan, MD.    [facility] 2/9 Scores 11/29/2020 05/26/2020 10/28/2019 08/07/2018  PHQ - 2 Score 0 0 0 0  PHQ- 9 Score 0 0 - -    GAD 7 : Generalized Anxiety Score 11/29/2020 05/26/2020  Nervous, Anxious, on Edge 0 0  Control/stop worrying 0 0  Worry too much - different things 0 0  Trouble relaxing 0 0  Restless 0 0  Easily annoyed or irritable 0 0  Afraid - awful might happen 0 0  Total GAD 7 Score 0 0  Anxiety Difficulty - Not difficult at all    BP Readings from Last 3 Encounters:  01/11/21 134/82  11/29/20 (!) 152/100  05/26/20 140/88    Physical Exam  Wt Readings from Last 3 Encounters:  01/11/21 (!) 362 lb (164.2 kg)  11/29/20 (!) 359 lb (162.8 kg)  05/26/20 (!) 362 lb (164.2 kg)    There were no vitals taken for this visit.  Assessment and  Plan:

## 2021-06-28 ENCOUNTER — Other Ambulatory Visit: Payer: Self-pay | Admitting: Internal Medicine

## 2021-06-28 DIAGNOSIS — I10 Essential (primary) hypertension: Secondary | ICD-10-CM

## 2021-07-28 ENCOUNTER — Telehealth: Payer: Self-pay

## 2021-07-28 ENCOUNTER — Other Ambulatory Visit: Payer: Self-pay | Admitting: Internal Medicine

## 2021-07-28 DIAGNOSIS — I10 Essential (primary) hypertension: Secondary | ICD-10-CM

## 2021-07-28 NOTE — Telephone Encounter (Signed)
Called pt to schedule an appoint for HTN.  [facility] nurse may give results to patient if they return call to clinic, a CRM has been created.  KP

## 2021-07-28 NOTE — Telephone Encounter (Signed)
Requested medication (s) are due for refill today - yes  Requested medication (s) are on the active medication list -yes  Future visit scheduled -yes-12/02/21  Last refill: 07/03/21  Notes to clinic: Attempted to call patient to schedule appointment- left message to call office. Last refill- courtesy with note. Rx request sent for PCP review   Requested Prescriptions  Pending Prescriptions Disp Refills   amLODipine (NORVASC) 10 MG tablet [Pharmacy Med Name: amLODIPine Besylate 10 MG Oral Tablet] 30 tablet 0    Sig: Take 1 tablet by mouth once daily     Cardiovascular:  Calcium Channel Blockers Failed - 07/28/2021 11:39 AM      Failed - Valid encounter within last 6 months    Recent Outpatient Visits           6 months ago Acute non-recurrent sinusitis, unspecified location   [facility] Duanne Limerick, [facility]   8 months ago Annual physical exam   [facility] Reubin Milan, MD   1 year ago Essential (primary) hypertension   [facility] Reubin Milan, MD   1 year ago Drug-induced erectile dysfunction   [facility] Reubin Milan, MD   2 years ago Rhinorrhea   [facility] Reubin Milan, MD       Future Appointments             In 4 months Reubin Milan, MD [facility], [facility]            Passed - Last BP in normal range    BP Readings from Last 1 Encounters:  01/11/21 134/82           losartan (COZAAR) 100 MG tablet [Pharmacy Med Name: Losartan Potassium 100 MG Oral Tablet] 30 tablet 0    Sig: TAKE 1 TABLET BY MOUTH ONCE DAILY. OFFICE VISIT NEEDED FOR REFILLS.     Cardiovascular:  Angiotensin Receptor Blockers Failed - 07/28/2021 11:39 AM      Failed - Cr in normal range and within 180 days    Creatinine  Date Value Ref Range Status  04/18/2014 1.41 (H) 0.60 - 1.30 mg/dL Final   Creatinine, Ser  Date Value Ref Range Status  11/29/2020 1.34 (H) 0.76 - 1.27 mg/dL Final          Failed - K  in normal range and within 180 days    Potassium  Date Value Ref Range Status  11/29/2020 4.1 3.5 - 5.2 mmol/L Final  04/18/2014 3.6 3.5 - 5.1 mmol/L Final          Failed - Valid encounter within last 6 months    Recent Outpatient Visits           6 months ago Acute non-recurrent sinusitis, unspecified location   [facility] Duanne Limerick, MD   8 months ago Annual physical exam   [facility] Reubin Milan, MD   1 year ago Essential (primary) hypertension   [facility] Reubin Milan, MD   1 year ago Drug-induced erectile dysfunction   [facility] Reubin Milan, MD   2 years ago Rhinorrhea   [facility] Reubin Milan, MD       Future Appointments             In 4 months Judithann Graves Nyoka Cowden, MD [facility]            Passed - Patient  is not pregnant      Passed - Last BP in normal range    BP Readings from Last 1 Encounters:  01/11/21 134/82             Requested Prescriptions  Pending Prescriptions Disp Refills   amLODipine (NORVASC) 10 MG tablet [Pharmacy Med Name: amLODIPine Besylate 10 MG Oral Tablet] 30 tablet 0    Sig: Take 1 tablet by mouth once daily     Cardiovascular:  Calcium Channel Blockers Failed - 07/28/2021 11:39 AM      Failed - Valid encounter within last 6 months    Recent Outpatient Visits           6 months ago Acute non-recurrent sinusitis, unspecified location   [facility] Duanne Limerick, MD   8 months ago Annual physical exam   [facility] Reubin Milan, MD   1 year ago Essential (primary) hypertension   [facility] Reubin Milan, MD   1 year ago Drug-induced erectile dysfunction   [facility] Reubin Milan, MD   2 years ago Rhinorrhea   [facility] Reubin Milan, MD       Future Appointments             In 4 months Reubin Milan, MD [facility], [facility]             Passed - Last BP in normal range    BP Readings from Last 1 Encounters:  01/11/21 134/82           losartan (COZAAR) 100 MG tablet [Pharmacy Med Name: Losartan Potassium 100 MG Oral Tablet] 30 tablet 0    Sig: TAKE 1 TABLET BY MOUTH ONCE DAILY. OFFICE VISIT NEEDED FOR REFILLS.     Cardiovascular:  Angiotensin Receptor Blockers Failed - 07/28/2021 11:39 AM      Failed - Cr in normal range and within 180 days    Creatinine  Date Value Ref Range Status  04/18/2014 1.41 (H) 0.60 - 1.30 mg/dL Final   Creatinine, Ser  Date Value Ref Range Status  11/29/2020 1.34 (H) 0.76 - 1.27 mg/dL Final          Failed - K in normal range and within 180 days    Potassium  Date Value Ref Range Status  11/29/2020 4.1 3.5 - 5.2 mmol/L Final  04/18/2014 3.6 3.5 - 5.1 mmol/L Final          Failed - Valid encounter within last 6 months    Recent Outpatient Visits           6 months ago Acute non-recurrent sinusitis, unspecified location   [facility] Duanne Limerick, MD   8 months ago Annual physical exam   [facility] Reubin Milan, MD   1 year ago Essential (primary) hypertension   [facility] Reubin Milan, MD   1 year ago Drug-induced erectile dysfunction   [facility] Reubin Milan, MD   2 years ago Rhinorrhea   [facility] Reubin Milan, MD       Future Appointments             In 4 months Judithann Graves Nyoka Cowden, MD [facility]            Passed - Patient is not pregnant      Passed - Last BP in normal range    BP Readings  from Last 1 Encounters:  01/11/21 134/82

## 2021-07-28 NOTE — Telephone Encounter (Signed)
Copied from CRM [id number]. Topic: Quick Communication - Rx Refill/Question >> Jul 28, 2021  1:51 PM Marylen Ponto wrote: Medication: carvedilol (COREG) 25 MG tablet  Has the patient contacted their pharmacy? Yes.   (Agent: If no, request that the patient contact the pharmacy for the refill.) (Agent: If yes, when and what did the pharmacy advise?)  Preferred Pharmacy (with phone number or street name): [facility] [address] [place]), [place] - [address] Phone: [phone]  Fax: [phone]  Agent: Please be advised that RX refills may take up to 3 business days. We ask that you follow-up with your pharmacy.

## 2021-07-29 MED ORDER — CARVEDILOL 25 MG PO TABS: 25.0000 mg | ORAL_TABLET | Freq: Two times a day (BID) | ORAL | 0 refills | Status: DC

## 2021-07-29 NOTE — Telephone Encounter (Signed)
Scheduled

## 2021-08-16 ENCOUNTER — Ambulatory Visit (INDEPENDENT_AMBULATORY_CARE_PROVIDER_SITE_OTHER): Payer: 59 | Admitting: Internal Medicine

## 2021-08-16 ENCOUNTER — Encounter: Payer: Self-pay | Admitting: Internal Medicine

## 2021-08-16 VITALS — BP 140/86 | HR 69 | Temp 98.1°F | Ht 72.0 in | Wt 371.0 lb

## 2021-08-16 DIAGNOSIS — N522 Drug-induced erectile dysfunction: Secondary | ICD-10-CM

## 2021-08-16 DIAGNOSIS — N1832 Chronic kidney disease, stage 3b: Secondary | ICD-10-CM

## 2021-08-16 DIAGNOSIS — I10 Essential (primary) hypertension: Secondary | ICD-10-CM

## 2021-08-16 MED ORDER — FUROSEMIDE 20 MG PO TABS: 20.0000 mg | ORAL_TABLET | Freq: Every day | ORAL | 5 refills | Status: DC

## 2021-08-16 MED ORDER — LOSARTAN POTASSIUM 100 MG PO TABS: 100.0000 mg | ORAL_TABLET | Freq: Every day | ORAL | 5 refills | Status: DC

## 2021-08-16 MED ORDER — CARVEDILOL 25 MG PO TABS: 25.0000 mg | ORAL_TABLET | Freq: Two times a day (BID) | ORAL | 5 refills | Status: DC

## 2021-08-16 MED ORDER — SILDENAFIL CITRATE 50 MG PO TABS: 25.0000 mg | ORAL_TABLET | Freq: Every day | ORAL | 0 refills | Status: DC | PRN

## 2021-08-16 MED ORDER — AMLODIPINE BESYLATE 10 MG PO TABS: 10.0000 mg | ORAL_TABLET | Freq: Every day | ORAL | 5 refills | Status: DC

## 2021-08-16 NOTE — Progress Notes (Signed)
Date:  08/16/2021   Name:  Bradley Love   DOB:  04-16-1966   MRN:  [id number]   Chief Complaint: Hypertension  Hypertension This is a chronic problem. The problem is controlled. Pertinent negatives include no chest pain, headaches, palpitations or shortness of breath. Past treatments include diuretics, angiotensin blockers, beta blockers and calcium channel blockers. The current treatment provides significant improvement. There are no compliance problems.  Hypertensive end-organ damage includes kidney disease. There is no history of CAD/MI or CVA.   Lab Results  Component Value Date   CREATININE 1.34 (H) 11/29/2020   BUN 15 11/29/2020   NA 142 11/29/2020   K 4.1 11/29/2020   CL 101 11/29/2020   CO2 25 11/29/2020   Lab Results  Component Value Date   CHOL 182 11/29/2020   HDL 42 11/29/2020   LDLCALC 121 (H) 11/29/2020   TRIG 105 11/29/2020   CHOLHDL 4.3 11/29/2020   Lab Results  Component Value Date   TSH 2.750 11/29/2020   No results found for: HGBA1C Lab Results  Component Value Date   WBC 11.5 (H) 11/29/2020   HGB 12.6 (L) 11/29/2020   HCT 38.2 11/29/2020   MCV 67 (L) 11/29/2020   PLT 123 (L) 11/29/2020   Lab Results  Component Value Date   ALT 11 11/29/2020   AST 12 11/29/2020   ALKPHOS 86 11/29/2020   BILITOT 1.3 (H) 11/29/2020     Review of Systems  Constitutional:  Negative for fatigue and unexpected weight change.  HENT:  Negative for nosebleeds.   Eyes:  Negative for visual disturbance.  Respiratory:  Negative for cough, chest tightness, shortness of breath and wheezing.   Cardiovascular:  Negative for chest pain, palpitations and leg swelling.  Gastrointestinal:  Negative for abdominal pain, constipation and diarrhea.  Musculoskeletal:  Positive for arthralgias and gait problem.  Neurological:  Negative for dizziness, weakness, light-headedness and headaches.   Patient Active Problem List   Diagnosis Date Noted   CSF leak 03/17/2021    Chronic renal failure, stage 3b (HCC) 11/29/2020   Rhinorrhea 10/28/2019   Obstructive sleep apnea syndrome 02/07/2019   Family history of sickle cell trait 10/31/2017   Morbid obesity with BMI of 45.0-49.9, adult (HCC) 09/13/2016   Absolute anemia 09/13/2016   Essential (primary) hypertension 07/27/2015   Encounter for screening for malignant neoplasm of prostate 07/27/2015    No Known Allergies  Past Surgical History:  Procedure Laterality Date   CHOLECYSTECTOMY  12/18/2000   CORNEAL TRANSPLANT Right 12/18/2008   SINUS EXPLORATION Bilateral 03/2021   for CSF leak    Social History   Tobacco Use   Smoking status: Former    Types: Cigarettes    Quit date: 1997    Years since quitting: 25.6   Smokeless tobacco: Never  Vaping Use   Vaping Use: Never used  Substance Use Topics   Alcohol use: Yes    Alcohol/week: 0.0 standard drinks    Comment: occasionally -3-4x/yr   Drug use: No     Medication list has been reviewed and updated.  Current Meds  Medication Sig   albuterol (VENTOLIN HFA) 108 (90 Base) MCG/ACT inhaler Inhale 2 puffs into the lungs 4 (four) times daily as needed.   aspirin 81 MG tablet Take 1 tablet by mouth daily.   azelastine (ASTELIN) 0.1 % nasal spray Place 1 spray into both nostrils 2 (two) times daily. Use in each nostril as directed (Patient taking differently: Place 1 spray into  both nostrils as needed. Use in each nostril as directed)   desloratadine (CLARINEX) 5 MG tablet Take 5 mg by mouth daily as needed.    Multiple Vitamins-Minerals (MULTIVITAL PO) Take by mouth daily.   Omega 3 1200 MG CAPS Take 1 capsule by mouth daily.   potassium chloride (MICRO-K) 10 MEQ CR capsule Take 1 capsule by mouth daily. Reported on 12/29/2015   UNABLE TO FIND Med Name: CPAP @@ 4 cm H2O   [DISCONTINUED] amLODipine (NORVASC) 10 MG tablet Take 1 tablet by mouth once daily   [DISCONTINUED] carvedilol (COREG) 25 MG tablet Take 1 tablet (25 mg total) by mouth 2 (two)  times daily.   [DISCONTINUED] furosemide (LASIX) 20 MG tablet Take 1 tablet (20 mg total) by mouth daily.   [DISCONTINUED] levETIRAcetam (KEPPRA) 500 MG tablet Take 500 mg by mouth 2 (two) times daily.   [DISCONTINUED] losartan (COZAAR) 100 MG tablet TAKE 1 TABLET BY MOUTH ONCE DAILY. OFFICE VISIT NEEDED FOR REFILLS.   [DISCONTINUED] sildenafil (VIAGRA) 50 MG tablet Take 0.5 tablets (25 mg total) by mouth daily as needed for erectile dysfunction.   [DISCONTINUED] sodium chloride (OCEAN) 0.65 % nasal spray Use 1 spray into each nostril Three (3) times a day.    PHQ 2/9 Scores 08/16/2021 11/29/2020 05/26/2020 10/28/2019  PHQ - 2 Score 0 0 0 0  PHQ- 9 Score 0 0 0 -    GAD 7 : Generalized Anxiety Score 08/16/2021 11/29/2020 05/26/2020  Nervous, Anxious, on Edge 0 0 0  Control/stop worrying 0 0 0  Worry too much - different things 1 0 0  Trouble relaxing 1 0 0  Restless 0 0 0  Easily annoyed or irritable 0 0 0  Afraid - awful might happen 0 0 0  Total GAD 7 Score 2 0 0  Anxiety Difficulty - - Not difficult at all    BP Readings from Last 3 Encounters:  08/16/21 140/86  01/11/21 134/82  11/29/20 (!) 152/100    Physical Exam Vitals and nursing note reviewed.  Constitutional:      General: He is not in acute distress.    Appearance: He is well-developed. He is obese.  HENT:     Head: Normocephalic and atraumatic.  Cardiovascular:     Rate and Rhythm: Normal rate and regular rhythm.     Pulses: Normal pulses.     Heart sounds: No murmur heard. Pulmonary:     Effort: Pulmonary effort is normal. No respiratory distress.     Breath sounds: No wheezing or rhonchi.  Musculoskeletal:     Cervical back: Normal range of motion.     Right lower leg: No edema.     Left lower leg: No edema.  Lymphadenopathy:     Cervical: No cervical adenopathy.  Skin:    General: Skin is warm and dry.     Findings: No rash.  Neurological:     General: No focal deficit present.     Mental Status: He is  alert and oriented to person, place, and time.  Psychiatric:        Mood and Affect: Mood normal.        Behavior: Behavior normal.    Wt Readings from Last 3 Encounters:  08/16/21 (!) 371 lb (168.3 kg)  01/11/21 (!) 362 lb (164.2 kg)  11/29/20 (!) 359 lb (162.8 kg)    BP 140/86   Pulse 69   Temp 98.1 F (36.7 C) (Oral)   Ht 6' (1.829 m)  Wt (!) 371 lb (168.3 kg)   SpO2 96%   BMI 50.32 kg/m   Assessment and Plan: 1. Essential (primary) hypertension Clinically stable exam with well controlled BP. Tolerating medications without side effects at this time. Pt to continue current regimen and low sodium diet; benefits of regular exercise as able discussed. - amLODipine (NORVASC) 10 MG tablet; Take 1 tablet (10 mg total) by mouth daily.  Dispense: 30 tablet; Refill: 5 - losartan (COZAAR) 100 MG tablet; Take 1 tablet (100 mg total) by mouth daily.  Dispense: 30 tablet; Refill: 5 - carvedilol (COREG) 25 MG tablet; Take 1 tablet (25 mg total) by mouth 2 (two) times daily.  Dispense: 60 tablet; Refill: 5 - furosemide (LASIX) 20 MG tablet; Take 1 tablet (20 mg total) by mouth daily.  Dispense: 30 tablet; Refill: 5  2. Chronic renal failure, stage 3b (HCC) Continue to monitor Stable during recent admission for CSF leak repair  3. Drug-induced erectile dysfunction - sildenafil (VIAGRA) 50 MG tablet; Take 0.5 tablets (25 mg total) by mouth daily as needed for erectile dysfunction.  Dispense: 10 tablet; Refill: 0   Partially dictated using Animal nutritionist. Any errors are unintentional.  Bari Edward, MD [facility]  08/16/2021

## 2021-09-07 ENCOUNTER — Ambulatory Visit (INDEPENDENT_AMBULATORY_CARE_PROVIDER_SITE_OTHER): Payer: 59 | Admitting: Internal Medicine

## 2021-09-07 ENCOUNTER — Other Ambulatory Visit: Payer: Self-pay

## 2021-09-07 ENCOUNTER — Encounter: Payer: Self-pay | Admitting: Internal Medicine

## 2021-09-07 VITALS — BP 112/78 | HR 84 | Temp 98.1°F | Ht 72.0 in | Wt 362.0 lb

## 2021-09-07 DIAGNOSIS — N3090 Cystitis, unspecified without hematuria: Secondary | ICD-10-CM

## 2021-09-07 DIAGNOSIS — R399 Unspecified symptoms and signs involving the genitourinary system: Secondary | ICD-10-CM

## 2021-09-07 DIAGNOSIS — Z23 Encounter for immunization: Secondary | ICD-10-CM

## 2021-09-07 DIAGNOSIS — Z6841 Body Mass Index (BMI) 40.0 and over, adult: Secondary | ICD-10-CM

## 2021-09-07 LAB — POCT URINALYSIS DIPSTICK
Bilirubin, UA: NEGATIVE
Blood, UA: NEGATIVE
Glucose, UA: NEGATIVE
Ketones, UA: NEGATIVE
Leukocytes, UA: NEGATIVE
Nitrite, UA: NEGATIVE
Protein, UA: POSITIVE — AB
Spec Grav, UA: 1.02 (ref 1.010–1.025)
Urobilinogen, UA: 0.2 E.U./dL
pH, UA: 5 (ref 5.0–8.0)

## 2021-09-07 MED ORDER — SULFAMETHOXAZOLE-TRIMETHOPRIM 800-160 MG PO TABS: 1.0000 | ORAL_TABLET | Freq: Two times a day (BID) | ORAL | 0 refills | Status: AC

## 2021-09-07 NOTE — Progress Notes (Signed)
Date:  09/07/2021   Name:  Bradley Love   DOB:  [birth year]   MRN:  [id number]   Chief Complaint: Cystitis (X 3 days, Lower back pain, urine has odor ) and Flu Vaccine  Urinary Tract Infection  This is a new problem. The problem occurs every urination. The patient is experiencing no pain. There has been no fever. Associated symptoms include flank pain. Pertinent negatives include no chills or urgency. Associated symptoms comments: And dark urine with odor. He has tried increased fluids for the symptoms.   Lab Results  Component Value Date   CREATININE 1.34 (H) 11/29/2020   BUN 15 11/29/2020   NA 142 11/29/2020   K 4.1 11/29/2020   CL 101 11/29/2020   CO2 25 11/29/2020   Lab Results  Component Value Date   CHOL 182 11/29/2020   HDL 42 11/29/2020   LDLCALC 121 (H) 11/29/2020   TRIG 105 11/29/2020   CHOLHDL 4.3 11/29/2020   Lab Results  Component Value Date   TSH 2.750 11/29/2020   No results found for: HGBA1C Lab Results  Component Value Date   WBC 11.5 (H) 11/29/2020   HGB 12.6 (L) 11/29/2020   HCT 38.2 11/29/2020   MCV 67 (L) 11/29/2020   PLT 123 (L) 11/29/2020   Lab Results  Component Value Date   ALT 11 11/29/2020   AST 12 11/29/2020   ALKPHOS 86 11/29/2020   BILITOT 1.3 (H) 11/29/2020     Review of Systems  Constitutional:  Negative for chills, fatigue and fever.  Respiratory:  Negative for chest tightness and shortness of breath.   Cardiovascular:  Negative for chest pain.  Gastrointestinal:  Negative for abdominal pain and diarrhea.  Genitourinary:  Positive for flank pain. Negative for penile discharge and urgency.   Patient Active Problem List   Diagnosis Date Noted   CSF leak 03/17/2021   Chronic renal failure, stage 3b (HCC) 11/29/2020   Rhinorrhea 10/28/2019   Obstructive sleep apnea syndrome 02/07/2019   Family history of sickle cell trait 10/31/2017   Morbid obesity with BMI of 45.0-49.9, adult (HCC) 09/13/2016   Absolute anemia  09/13/2016   Essential (primary) hypertension 07/27/2015   Encounter for screening for malignant neoplasm of prostate 07/27/2015    No Known Allergies  Past Surgical History:  Procedure Laterality Date   CHOLECYSTECTOMY  12/18/2000   CORNEAL TRANSPLANT Right 12/18/2008   SINUS EXPLORATION Bilateral 03/2021   for CSF leak    Social History   Tobacco Use   Smoking status: Former    Types: Cigarettes    Quit date: 1997    Years since quitting: 25.7   Smokeless tobacco: Never  Vaping Use   Vaping Use: Never used  Substance Use Topics   Alcohol use: Yes    Alcohol/week: 0.0 standard drinks    Comment: occasionally -3-4x/yr   Drug use: No     Medication list has been reviewed and updated.  Current Meds  Medication Sig   albuterol (VENTOLIN HFA) 108 (90 Base) MCG/ACT inhaler Inhale 2 puffs into the lungs 4 (four) times daily as needed.   amLODipine (NORVASC) 10 MG tablet Take 1 tablet (10 mg total) by mouth daily.   aspirin 81 MG tablet Take 1 tablet by mouth daily.   carvedilol (COREG) 25 MG tablet Take 1 tablet (25 mg total) by mouth 2 (two) times daily.   desloratadine (CLARINEX) 5 MG tablet Take 5 mg by mouth daily as needed.  furosemide (LASIX) 20 MG tablet Take 1 tablet (20 mg total) by mouth daily.   losartan (COZAAR) 100 MG tablet Take 1 tablet (100 mg total) by mouth daily.   Multiple Vitamins-Minerals (MULTIVITAL PO) Take by mouth daily.   Omega 3 1200 MG CAPS Take 1 capsule by mouth daily.   potassium chloride (MICRO-K) 10 MEQ CR capsule Take 1 capsule by mouth daily. Reported on 12/29/2015   sildenafil (VIAGRA) 50 MG tablet Take 0.5 tablets (25 mg total) by mouth daily as needed for erectile dysfunction.   sulfamethoxazole-trimethoprim (BACTRIM DS) 800-160 MG tablet Take 1 tablet by mouth 2 (two) times daily for 5 days.   UNABLE TO FIND Med Name: CPAP @@ 4 cm H2O    PHQ 2/9 Scores 09/07/2021 08/16/2021 11/29/2020 05/26/2020  PHQ - 2 Score 0 0 0 0  PHQ- 9 Score  1 0 0 0    GAD 7 : Generalized Anxiety Score 09/07/2021 08/16/2021 11/29/2020 05/26/2020  Nervous, Anxious, on Edge 0 0 0 0  Control/stop worrying 0 0 0 0  Worry too much - different things 1 1 0 0  Trouble relaxing 0 1 0 0  Restless 0 0 0 0  Easily annoyed or irritable 0 0 0 0  Afraid - awful might happen 0 0 0 0  Total GAD 7 Score 1 2 0 0  Anxiety Difficulty - - - Not difficult at all    BP Readings from Last 3 Encounters:  09/07/21 112/78  08/16/21 140/86  01/11/21 134/82    Physical Exam Vitals and nursing note reviewed.  Constitutional:      General: He is not in acute distress.    Appearance: Normal appearance. He is well-developed.  HENT:     Head: Normocephalic and atraumatic.  Cardiovascular:     Rate and Rhythm: Normal rate and regular rhythm.     Pulses: Normal pulses.     Heart sounds: No murmur heard. Pulmonary:     Effort: Pulmonary effort is normal. No respiratory distress.     Breath sounds: No wheezing or rhonchi.  Abdominal:     Palpations: Abdomen is soft.     Tenderness: There is right CVA tenderness. There is no left CVA tenderness.  Skin:    General: Skin is warm and dry.     Findings: No rash.  Neurological:     Mental Status: He is alert and oriented to person, place, and time.  Psychiatric:        Mood and Affect: Mood normal.        Behavior: Behavior normal.    Wt Readings from Last 3 Encounters:  09/07/21 (!) 362 lb (164.2 kg)  08/16/21 (!) 371 lb (168.3 kg)  01/11/21 (!) 362 lb (164.2 kg)    BP 112/78   Pulse 84   Temp 98.1 F (36.7 C) (Oral)   Ht 6' (1.829 m)   Wt (!) 362 lb (164.2 kg)   SpO2 96%   BMI 49.10 kg/m   Assessment and Plan: 1. Bladder infection Possible early symptoms before the UA is revealing UA negative Continue increased fluids - POCT urinalysis dipstick  2. Urinary tract infection symptoms - sulfamethoxazole-trimethoprim (BACTRIM DS) 800-160 MG tablet; Take 1 tablet by mouth 2 (two) times daily for 5  days.  Dispense: 10 tablet; Refill: 0  3. Morbid obesity with BMI of 45.0-49.9, adult (HCC) Has lost a few pounds since last visit  4. Need for immunization against influenza - Flu Vaccine QUAD 25mo+IM (  Fluarix, Fluzone & Alfiuria Quad PF)   Partially dictated using Animal nutritionist. Any errors are unintentional.  Bari Edward, MD [facility]  09/07/2021

## 2021-12-02 ENCOUNTER — Encounter: Payer: Self-pay | Admitting: Internal Medicine

## 2021-12-02 ENCOUNTER — Ambulatory Visit (INDEPENDENT_AMBULATORY_CARE_PROVIDER_SITE_OTHER): Payer: PRIVATE HEALTH INSURANCE | Admitting: Internal Medicine

## 2021-12-02 ENCOUNTER — Other Ambulatory Visit: Payer: Self-pay

## 2021-12-02 VITALS — BP 128/84 | HR 92 | Ht 72.0 in | Wt 367.0 lb

## 2021-12-02 DIAGNOSIS — Z0000 Encounter for general adult medical examination without abnormal findings: Secondary | ICD-10-CM | POA: Diagnosis not present

## 2021-12-02 DIAGNOSIS — G4733 Obstructive sleep apnea (adult) (pediatric): Secondary | ICD-10-CM | POA: Diagnosis not present

## 2021-12-02 DIAGNOSIS — Z1211 Encounter for screening for malignant neoplasm of colon: Secondary | ICD-10-CM

## 2021-12-02 DIAGNOSIS — E785 Hyperlipidemia, unspecified: Secondary | ICD-10-CM | POA: Insufficient documentation

## 2021-12-02 DIAGNOSIS — N1832 Chronic kidney disease, stage 3b: Secondary | ICD-10-CM

## 2021-12-02 DIAGNOSIS — Z125 Encounter for screening for malignant neoplasm of prostate: Secondary | ICD-10-CM

## 2021-12-02 DIAGNOSIS — I480 Paroxysmal atrial fibrillation: Secondary | ICD-10-CM | POA: Insufficient documentation

## 2021-12-02 DIAGNOSIS — I10 Essential (primary) hypertension: Secondary | ICD-10-CM

## 2021-12-02 DIAGNOSIS — I4891 Unspecified atrial fibrillation: Secondary | ICD-10-CM | POA: Insufficient documentation

## 2021-12-02 LAB — POCT URINALYSIS DIPSTICK
Bilirubin, UA: NEGATIVE
Blood, UA: NEGATIVE
Glucose, UA: NEGATIVE
Ketones, UA: NEGATIVE
Leukocytes, UA: NEGATIVE
Nitrite, UA: NEGATIVE
Protein, UA: POSITIVE — AB
Spec Grav, UA: 1.02 (ref 1.010–1.025)
Urobilinogen, UA: 0.2 E.U./dL
pH, UA: 6.5 (ref 5.0–8.0)

## 2021-12-02 NOTE — Progress Notes (Signed)
Date:  12/02/2021   Name:  Bradley Love   DOB:  1965/12/22   MRN:  [id number]   Chief Complaint: Annual Exam ARRIE STARBIRD is a 55 y.o. male who presents today for his Complete Annual Exam. He feels well. He reports exercising - going to the gym. He reports he is sleeping well.  He went to [facility] ED last night for chest heaviness and rapid heartbeat.  Labs were normal but EKG showed Afib.  He was given IV medication that restored SR and he was prescribed Eliquis and Cardiazem.  He did not get either yet - the Eliquis is $160.  Colonoscopy: none  Immunization History  Administered Date(s) Administered   HiB (PRP-T) 04/03/2010   Influenza,inj,Quad PF,6+ Mos 09/13/2016, 10/28/2019, 11/29/2020, 09/07/2021   Influenza-Unspecified 12/17/2015, 10/15/2017, 10/08/2018, 12/31/2018   Meningococcal Conjugate 04/04/2010, 03/03/2019   Moderna Sars-Covid-2 Vaccination 04/21/2020, 05/22/2020   Pneumococcal Conjugate-13 11/11/2018   Pneumococcal Polysaccharide-23 04/03/2010   Tdap 12/20/2003   Zoster Recombinat (Shingrix) 12/01/2020, 03/10/2021    Lab Results  Component Value Date   PSA1 0.8 11/29/2020   PSA1 0.8 08/07/2018   PSA 0.5 11/05/2014    Hypertension This is a chronic problem. The problem is controlled. Associated symptoms include palpitations. Pertinent negatives include no chest pain, headaches or shortness of breath. Past treatments include calcium channel blockers, beta blockers, angiotensin blockers and diuretics. The current treatment provides significant improvement. Hypertensive end-organ damage includes kidney disease. There is no history of CAD/MI or CVA.   Lab Results  Component Value Date   NA 142 11/29/2020   K 4.1 11/29/2020   CO2 25 11/29/2020   GLUCOSE 113 (H) 11/29/2020   BUN 15 11/29/2020   CREATININE 1.34 (H) 11/29/2020   CALCIUM 9.5 11/29/2020   GFRNONAA 59 (L) 11/29/2020   Lab Results  Component Value Date   CHOL 182 11/29/2020   HDL 42  11/29/2020   LDLCALC 121 (H) 11/29/2020   TRIG 105 11/29/2020   CHOLHDL 4.3 11/29/2020   Lab Results  Component Value Date   TSH 2.750 11/29/2020   No results found for: HGBA1C Lab Results  Component Value Date   WBC 11.5 (H) 11/29/2020   HGB 12.6 (L) 11/29/2020   HCT 38.2 11/29/2020   MCV 67 (L) 11/29/2020   PLT 123 (L) 11/29/2020   Lab Results  Component Value Date   ALT 11 11/29/2020   AST 12 11/29/2020   ALKPHOS 86 11/29/2020   BILITOT 1.3 (H) 11/29/2020   No results found for: 25OHVITD2, 25OHVITD3, VD25OH   Review of Systems  Constitutional:  Negative for appetite change, chills, diaphoresis, fatigue and unexpected weight change.  HENT:  Negative for hearing loss, tinnitus, trouble swallowing and voice change.   Eyes:  Negative for visual disturbance.  Respiratory:  Negative for choking, shortness of breath and wheezing.   Cardiovascular:  Positive for palpitations. Negative for chest pain and leg swelling.  Gastrointestinal:  Negative for abdominal pain, blood in stool, constipation and diarrhea.  Genitourinary:  Negative for difficulty urinating, dysuria, frequency and hematuria.  Musculoskeletal:  Negative for arthralgias, back pain and myalgias.  Skin:  Negative for color change and rash.  Neurological:  Negative for dizziness, syncope and headaches.  Hematological:  Negative for adenopathy.  Psychiatric/Behavioral:  Negative for dysphoric mood and sleep disturbance. The patient is not nervous/anxious.    Patient Active Problem List   Diagnosis Date Noted   Atrial fibrillation ([place]) 12/02/2021   Mild hyperlipidemia 12/02/2021  CSF leak 03/17/2021   Chronic renal failure, stage 3b ([place]) 11/29/2020   Rhinorrhea 10/28/2019   Obstructive sleep apnea syndrome 02/07/2019   Family history of sickle cell trait 10/31/2017   Morbid obesity with BMI of 45.0-49.9, adult ([place]) 09/13/2016   Absolute anemia 09/13/2016   Essential (primary) hypertension 07/27/2015    Encounter for screening for malignant neoplasm of prostate 07/27/2015    No Known Allergies  Past Surgical History:  Procedure Laterality Date   CHOLECYSTECTOMY  12/18/2000   CORNEAL TRANSPLANT Right 12/18/2008   SINUS EXPLORATION Bilateral 03/2021   for CSF leak    Social History   Tobacco Use   Smoking status: Former    Types: Cigarettes    Quit date: 1997    Years since quitting: 25.9   Smokeless tobacco: Never  Vaping Use   Vaping Use: Never used  Substance Use Topics   Alcohol use: Yes    Alcohol/week: 0.0 standard drinks    Comment: occasionally -3-4x/yr   Drug use: No     Medication list has been reviewed and updated.  Current Meds  Medication Sig   albuterol (VENTOLIN HFA) 108 (90 Base) MCG/ACT inhaler Inhale 2 puffs into the lungs 4 (four) times daily as needed.   amLODipine (NORVASC) 10 MG tablet Take 1 tablet (10 mg total) by mouth daily.   aspirin 81 MG tablet Take 1 tablet by mouth daily.   carvedilol (COREG) 25 MG tablet Take 1 tablet (25 mg total) by mouth 2 (two) times daily.   furosemide (LASIX) 20 MG tablet Take 1 tablet (20 mg total) by mouth daily.   losartan (COZAAR) 100 MG tablet Take 1 tablet (100 mg total) by mouth daily.   Multiple Vitamins-Minerals (MULTIVITAL PO) Take by mouth daily.   Omega 3 1200 MG CAPS Take 1 capsule by mouth daily.   potassium chloride (MICRO-K) 10 MEQ CR capsule Take 1 capsule by mouth daily. Reported on 12/29/2015   sildenafil (VIAGRA) 50 MG tablet Take 0.5 tablets (25 mg total) by mouth daily as needed for erectile dysfunction.   UNABLE TO FIND Med Name: CPAP @@ 4 cm H2O    PHQ 2/9 Scores 12/02/2021 09/07/2021 08/16/2021 11/29/2020  PHQ - 2 Score 0 0 0 0  PHQ- 9 Score 2 1 0 0    GAD 7 : Generalized Anxiety Score 12/02/2021 09/07/2021 08/16/2021 11/29/2020  Nervous, Anxious, on Edge 0 0 0 0  Control/stop worrying 0 0 0 0  Worry too much - different things 1 1 1  0  Trouble relaxing 0 0 1 0  Restless 0 0 0 0   Easily annoyed or irritable 0 0 0 0  Afraid - awful might happen 0 0 0 0  Total GAD 7 Score 1 1 2  0  Anxiety Difficulty Not difficult at all - - -    BP Readings from Last 3 Encounters:  12/02/21 128/84  09/07/21 112/78  08/16/21 140/86    Physical Exam Vitals and nursing note reviewed.  Constitutional:      Appearance: Normal appearance. He is well-developed. He is obese.  HENT:     Head: Normocephalic.     Right Ear: Tympanic membrane, ear canal and external ear normal.     Left Ear: Tympanic membrane, ear canal and external ear normal.     Nose: Nose normal.  Eyes:     Conjunctiva/sclera: Conjunctivae normal.     Pupils: Pupils are equal, round, and reactive to light.  Neck:  Thyroid: No thyromegaly.     Vascular: No carotid bruit.  Cardiovascular:     Rate and Rhythm: Normal rate and regular rhythm. No extrasystoles are present.    Heart sounds: Heart sounds are distant.  Pulmonary:     Effort: Pulmonary effort is normal.     Breath sounds: Normal breath sounds. No wheezing.  Chest:  Breasts:    Right: No mass.     Left: No mass.  Abdominal:     General: Bowel sounds are normal.     Palpations: Abdomen is soft.     Tenderness: There is no abdominal tenderness.  Musculoskeletal:        General: Normal range of motion.     Cervical back: Normal range of motion and neck supple.  Lymphadenopathy:     Cervical: No cervical adenopathy.  Skin:    General: Skin is warm and dry.  Neurological:     Mental Status: He is alert and oriented to person, place, and time.     Deep Tendon Reflexes: Reflexes are normal and symmetric.  Psychiatric:        Attention and Perception: Attention normal.        Mood and Affect: Mood normal.        Thought Content: Thought content normal.    Wt Readings from Last 3 Encounters:  12/02/21 (!) 367 lb (166.5 kg)  09/07/21 (!) 362 lb (164.2 kg)  08/16/21 (!) 371 lb (168.3 kg)    BP 128/84    Pulse 92    Ht 6' (1.829 m)    Wt  (!) 367 lb (166.5 kg)    SpO2 97%    BMI 49.77 kg/m   Assessment and Plan: 1. Annual physical exam Exam is normal except for weight. Encourage regular exercise and appropriate dietary changes. - Hemoglobin A1c  2. Prostate cancer screening DRE deferred to lack of sx - PSA  3. Essential (primary) hypertension Clinically stable exam with well controlled BP. Tolerating medications without side effects at this time. Pt to continue current regimen and low sodium diet; benefits of regular exercise as able discussed. - CBC with Differential/Platelet - POCT urinalysis dipstick  4. Chronic renal failure, stage 3b (HCC) Labs being monitored - Comprehensive metabolic panel  5. Obstructive sleep apnea syndrome Continue nightly CPap  6. Mild hyperlipidemia Not currently on medication - Lipid panel  7. Atrial fibrillation, unspecified type (HCC) New onset last evening. Get Cardiazem to use PRN rapid HR Samples of Eliquis 5 mg bid x 14 days given Seeing Cardiology on Mondya  8. Colon cancer screening - Cologuard   Partially dictated using Animal nutritionist. Any errors are unintentional.  Bari Edward, MD [facility]  12/02/2021

## 2021-12-03 LAB — COMPREHENSIVE METABOLIC PANEL
ALT: 16 IU/L (ref 0–44)
AST: 15 IU/L (ref 0–40)
Albumin/Globulin Ratio: 2.7 — ABNORMAL HIGH (ref 1.2–2.2)
Albumin: 4.8 g/dL (ref 3.8–4.9)
Alkaline Phosphatase: 72 IU/L (ref 44–121)
BUN/Creatinine Ratio: 18 (ref 9–20)
BUN: 25 mg/dL — ABNORMAL HIGH (ref 6–24)
Bilirubin Total: 1.4 mg/dL — ABNORMAL HIGH (ref 0.0–1.2)
CO2: 25 mmol/L (ref 20–29)
Calcium: 9.5 mg/dL (ref 8.7–10.2)
Chloride: 105 mmol/L (ref 96–106)
Creatinine, Ser: 1.37 mg/dL — ABNORMAL HIGH (ref 0.76–1.27)
Globulin, Total: 1.8 g/dL (ref 1.5–4.5)
Glucose: 138 mg/dL — ABNORMAL HIGH (ref 70–99)
Potassium: 4.2 mmol/L (ref 3.5–5.2)
Sodium: 144 mmol/L (ref 134–144)
Total Protein: 6.6 g/dL (ref 6.0–8.5)
eGFR: 60 mL/min/{1.73_m2} (ref >59)

## 2021-12-03 LAB — LIPID PANEL
Chol/HDL Ratio: 4.7 ratio (ref 0.0–5.0)
Cholesterol, Total: 177 mg/dL (ref 100–199)
HDL: 38 mg/dL — ABNORMAL LOW (ref >39)
LDL Chol Calc (NIH): 115 mg/dL — ABNORMAL HIGH (ref 0–99)
Triglycerides: 132 mg/dL (ref 0–149)
VLDL Cholesterol Cal: 24 mg/dL (ref 5–40)

## 2021-12-03 LAB — CBC WITH DIFFERENTIAL/PLATELET
Basophils Absolute: 0 10*3/uL (ref 0.0–0.2)
Basos: 0 %
EOS (ABSOLUTE): 0.1 10*3/uL (ref 0.0–0.4)
Eos: 1 %
Hematocrit: 39.6 % (ref 37.5–51.0)
Hemoglobin: 13.1 g/dL (ref 13.0–17.7)
Immature Grans (Abs): 0 10*3/uL (ref 0.0–0.1)
Immature Granulocytes: 0 %
Lymphocytes Absolute: 2.3 10*3/uL (ref 0.7–3.1)
Lymphs: 23 %
MCH: 22.4 pg — ABNORMAL LOW (ref 26.6–33.0)
MCHC: 33.1 g/dL (ref 31.5–35.7)
MCV: 68 fL — ABNORMAL LOW (ref 79–97)
Monocytes Absolute: 0.5 10*3/uL (ref 0.1–0.9)
Monocytes: 4 %
Neutrophils Absolute: 7.4 10*3/uL — ABNORMAL HIGH (ref 1.4–7.0)
Neutrophils: 72 %
Platelets: 139 10*3/uL — ABNORMAL LOW (ref 150–450)
RBC: 5.85 x10E6/uL — ABNORMAL HIGH (ref 4.14–5.80)
RDW: 20.1 % — ABNORMAL HIGH (ref 11.6–15.4)
WBC: 10.4 10*3/uL (ref 3.4–10.8)

## 2021-12-03 LAB — PSA: Prostate Specific Ag, Serum: 0.8 ng/mL (ref 0.0–4.0)

## 2021-12-03 LAB — HEMOGLOBIN A1C
Est. average glucose Bld gHb Est-mCnc: 117 mg/dL
Hgb A1c MFr Bld: 5.7 % — ABNORMAL HIGH (ref 4.8–5.6)

## 2021-12-13 ENCOUNTER — Encounter: Payer: Self-pay | Admitting: Internal Medicine

## 2021-12-13 ENCOUNTER — Telehealth: Payer: Self-pay

## 2021-12-13 ENCOUNTER — Other Ambulatory Visit: Payer: Self-pay

## 2021-12-13 ENCOUNTER — Ambulatory Visit (INDEPENDENT_AMBULATORY_CARE_PROVIDER_SITE_OTHER): Payer: PRIVATE HEALTH INSURANCE | Admitting: Internal Medicine

## 2021-12-13 VITALS — BP 136/82 | HR 96 | Temp 100.1°F | Ht 72.0 in | Wt 351.0 lb

## 2021-12-13 DIAGNOSIS — I10 Essential (primary) hypertension: Secondary | ICD-10-CM | POA: Diagnosis not present

## 2021-12-13 DIAGNOSIS — J069 Acute upper respiratory infection, unspecified: Secondary | ICD-10-CM | POA: Diagnosis not present

## 2021-12-13 MED ORDER — AZITHROMYCIN 250 MG PO TABS: ORAL_TABLET | ORAL | 0 refills | Status: AC

## 2021-12-13 MED ORDER — PROMETHAZINE-DM 6.25-15 MG/5ML PO SYRP: 5.0000 mL | ORAL_SOLUTION | Freq: Four times a day (QID) | ORAL | 0 refills | Status: DC | PRN

## 2021-12-13 NOTE — Telephone Encounter (Signed)
Pt.'s home COVID test was negative last night.

## 2021-12-13 NOTE — Telephone Encounter (Signed)
Noted  KP 

## 2021-12-13 NOTE — Progress Notes (Signed)
Date:  12/13/2021   Name:  Bradley Love   DOB:  [birth year]   MRN:  [id number]   Chief Complaint: Cough  Cough This is a new problem. The current episode started in the past 7 days. The problem occurs every few minutes. The cough is Non-productive. Associated symptoms include a fever, postnasal drip, rhinorrhea and wheezing. Pertinent negatives include no chest pain, chills, ear pain, headaches or sore throat. Associated symptoms comments: Yellow, mucous. He has tried OTC cough suppressant for the symptoms. The treatment provided mild relief.  Hypertension This is a chronic problem. The problem has been gradually improving since onset. Pertinent negatives include no chest pain, headaches or palpitations. Past treatments include calcium channel blockers, beta blockers, angiotensin blockers and diuretics (spironolactone added by cardiology).   Lab Results  Component Value Date   NA 144 12/02/2021   K 4.2 12/02/2021   CO2 25 12/02/2021   GLUCOSE 138 (H) 12/02/2021   BUN 25 (H) 12/02/2021   CREATININE 1.37 (H) 12/02/2021   CALCIUM 9.5 12/02/2021   EGFR 60 12/02/2021   GFRNONAA 59 (L) 11/29/2020   Lab Results  Component Value Date   CHOL 177 12/02/2021   HDL 38 (L) 12/02/2021   LDLCALC 115 (H) 12/02/2021   TRIG 132 12/02/2021   CHOLHDL 4.7 12/02/2021   Lab Results  Component Value Date   TSH 2.750 11/29/2020   Lab Results  Component Value Date   HGBA1C 5.7 (H) 12/02/2021   Lab Results  Component Value Date   WBC 10.4 12/02/2021   HGB 13.1 12/02/2021   HCT 39.6 12/02/2021   MCV 68 (L) 12/02/2021   PLT 139 (L) 12/02/2021   Lab Results  Component Value Date   ALT 16 12/02/2021   AST 15 12/02/2021   ALKPHOS 72 12/02/2021   BILITOT 1.4 (H) 12/02/2021   No results found for: 25OHVITD2, 25OHVITD3, VD25OH   Review of Systems  Constitutional:  Positive for fever. Negative for chills, diaphoresis and fatigue.  HENT:  Positive for congestion, postnasal drip and  rhinorrhea. Negative for ear pain, sinus pressure, sore throat and trouble swallowing.   Respiratory:  Positive for cough and wheezing. Negative for chest tightness.   Cardiovascular:  Negative for chest pain, palpitations and leg swelling.  Gastrointestinal:  Negative for diarrhea, nausea and vomiting.  Neurological:  Negative for dizziness, light-headedness and headaches.  Psychiatric/Behavioral:  Positive for sleep disturbance (due to cough). Negative for dysphoric mood. The patient is not nervous/anxious.    Patient Active Problem List   Diagnosis Date Noted   Atrial fibrillation ([place]) 12/02/2021   Mild hyperlipidemia 12/02/2021   CSF leak 03/17/2021   Chronic renal failure, stage 3b ([place]) 11/29/2020   Rhinorrhea 10/28/2019   Obstructive sleep apnea syndrome 02/07/2019   Family history of sickle cell trait 10/31/2017   Morbid obesity with BMI of 45.0-49.9, adult ([place]) 09/13/2016   Absolute anemia 09/13/2016   Essential (primary) hypertension 07/27/2015   Encounter for screening for malignant neoplasm of prostate 07/27/2015    No Known Allergies  Past Surgical History:  Procedure Laterality Date   CHOLECYSTECTOMY  12/18/2000   CORNEAL TRANSPLANT Right 12/18/2008   SINUS EXPLORATION Bilateral 03/2021   for CSF leak    Social History   Tobacco Use   Smoking status: Former    Types: Cigarettes    Quit date: 1997    Years since quitting: 26.0   Smokeless tobacco: Never  Vaping Use   Vaping Use: Never used  Substance Use Topics   Alcohol use: Yes    Alcohol/week: 0.0 standard drinks    Comment: occasionally -3-4x/yr   Drug use: No     Medication list has been reviewed and updated.  Current Meds  Medication Sig   albuterol (VENTOLIN HFA) 108 (90 Base) MCG/ACT inhaler Inhale 2 puffs into the lungs 4 (four) times daily as needed.   amLODipine (NORVASC) 10 MG tablet Take 1 tablet (10 mg total) by mouth daily.   apixaban (ELIQUIS) 5 MG TABS tablet Take by mouth.    aspirin 81 MG tablet Take 1 tablet by mouth daily.   carvedilol (COREG) 25 MG tablet Take 1 tablet (25 mg total) by mouth 2 (two) times daily.   diltiazem (CARDIZEM) 30 MG tablet Take 60 mg by mouth 2 (two) times daily.   furosemide (LASIX) 20 MG tablet Take 1 tablet (20 mg total) by mouth daily.   losartan (COZAAR) 100 MG tablet Take 1 tablet (100 mg total) by mouth daily.   Multiple Vitamins-Minerals (MULTIVITAL PO) Take by mouth daily.   Omega 3 1200 MG CAPS Take 1 capsule by mouth daily.   potassium chloride (MICRO-K) 10 MEQ CR capsule Take 1 capsule by mouth daily. Reported on 12/29/2015   sildenafil (VIAGRA) 50 MG tablet Take 0.5 tablets (25 mg total) by mouth daily as needed for erectile dysfunction.   spironolactone (ALDACTONE) 25 MG tablet Take 25 mg by mouth daily.   UNABLE TO FIND Med Name: CPAP @@ 4 cm H2O    PHQ 2/9 Scores 12/13/2021 12/02/2021 09/07/2021 08/16/2021  PHQ - 2 Score 0 0 0 0  PHQ- 9 Score 2 2 1  0    GAD 7 : Generalized Anxiety Score 12/13/2021 12/02/2021 09/07/2021 08/16/2021  Nervous, Anxious, on Edge 0 0 0 0  Control/stop worrying 0 0 0 0  Worry too much - different things 0 1 1 1   Trouble relaxing 1 0 0 1  Restless 0 0 0 0  Easily annoyed or irritable 0 0 0 0  Afraid - awful might happen 0 0 0 0  Total GAD 7 Score 1 1 1 2   Anxiety Difficulty Not difficult at all Not difficult at all - -    BP Readings from Last 3 Encounters:  12/13/21 136/82  12/02/21 128/84  09/07/21 112/78    Physical Exam Vitals and nursing note reviewed.  Constitutional:      General: He is not in acute distress.    Appearance: Normal appearance. He is well-developed.  HENT:     Head: Normocephalic and atraumatic.     Right Ear: Tympanic membrane and ear canal normal.     Left Ear: Tympanic membrane and ear canal normal.     Nose:     Right Sinus: No maxillary sinus tenderness or frontal sinus tenderness.     Left Sinus: No maxillary sinus tenderness or frontal sinus  tenderness.  Cardiovascular:     Rate and Rhythm: Normal rate and regular rhythm.  Pulmonary:     Effort: Pulmonary effort is normal. No respiratory distress.     Breath sounds: No wheezing or rhonchi.  Musculoskeletal:     Cervical back: Normal range of motion.  Lymphadenopathy:     Cervical: No cervical adenopathy.  Skin:    General: Skin is warm and dry.     Findings: No rash.  Neurological:     Mental Status: He is alert and oriented to person, place, and time.  Psychiatric:  Mood and Affect: Mood normal.        Behavior: Behavior normal.    Wt Readings from Last 3 Encounters:  12/13/21 (!) 351 lb (159.2 kg)  12/02/21 (!) 367 lb (166.5 kg)  09/07/21 (!) 362 lb (164.2 kg)    BP 136/82    Pulse 96    Temp 100.1 F (37.8 C) (Oral)    Ht 6' (1.829 m)    Wt (!) 351 lb (159.2 kg)    SpO2 96%    BMI 47.60 kg/m   Assessment and Plan: 1. Viral URI with cough He has a productive cough which could be early bacterial bronchitis.  Due to hx of CSF leak will treat with antibiotics. Fluids and tylenol and rest recommended for the next 36-48 hours. - azithromycin (ZITHROMAX Z-PAK) 250 MG tablet; UAD  Dispense: 6 each; Refill: 0 - promethazine-dextromethorphan (PROMETHAZINE-DM) 6.25-15 MG/5ML syrup; Take 5 mLs by mouth 4 (four) times daily as needed for cough.  Dispense: 180 mL; Refill: 0  2. Essential (primary) hypertension Clinically stable exam with well controlled BP, improved with the addition of spironolactone. Tolerating medications without side effects at this time. Pt to continue current regimen and low sodium diet; benefits of regular exercise as able discussed.    Partially dictated using Editor, commissioning. Any errors are unintentional.  Halina Maidens, MD [facility]  12/13/2021

## 2021-12-13 NOTE — Telephone Encounter (Signed)
Called pt to see if he has taken a Covid test. Its ok if patient has now taken one we can do one in the office if needed.  [facility] nurse may give results to patient if they return call to clinic, a CRM has been created.  KP

## 2021-12-15 ENCOUNTER — Other Ambulatory Visit: Payer: Self-pay | Admitting: Internal Medicine

## 2021-12-15 ENCOUNTER — Ambulatory Visit: Payer: Self-pay

## 2021-12-15 DIAGNOSIS — J069 Acute upper respiratory infection, unspecified: Secondary | ICD-10-CM

## 2021-12-15 MED ORDER — PREDNISONE 10 MG PO TABS: 10.0000 mg | ORAL_TABLET | ORAL | 0 refills | Status: AC

## 2021-12-15 NOTE — Telephone Encounter (Signed)
Please review.  KP

## 2021-12-15 NOTE — Telephone Encounter (Signed)
Called pt left VM that prednisone was sent in to [facility]. Pt name was stated on VM.  KP

## 2021-12-15 NOTE — Telephone Encounter (Signed)
Patient was seen 12/14/2021 and states PCP normally will prescribe prednisone to open up his airways and wanted to know if PCP can send in rx. Patient denied any shortness of breath but unsure if another appointment would be needed because he was just seen 12/14/2021, please advise    Chief Complaint: Questioning why prednisone was not prescribed and could it be prescribed. Symptoms: No worsening, seen 12/13/21 Frequency:  Pertinent Negatives: Patient denies any worsening symptoms since seen in office Disposition: [] ED /[] Urgent Care (no appt availability in office) / [] Appointment(In office/virtual)/ []  [facility]/ [] Home Care/ [] Refused Recommended Disposition  Additional Notes: Please advise. Requesting prednisone "Gets me over this quicker."     Reason for Disposition  [1] Caller has NON-URGENT medicine question about med that PCP prescribed AND [2] triager unable to answer question  Answer Assessment - Initial Assessment Questions 1. NAME of MEDICATION: "What medicine are you calling about?"     Prednisone 2. QUESTION: "What is your question?" (e.g., double dose of medicine, side effect) Usually prescribed.    3. PRESCRIBING HCP: "Who prescribed it?" Reason: if prescribed by specialist, call should be referred to that group.     *No Answer* 4. SYMPTOMS: "Do you have any symptoms?"     No worsening 5. SEVERITY: If symptoms are present, ask "Are they mild, moderate or severe?"     *No Answer*  Protocols used: Medication Question Call-A-AH

## 2022-01-26 LAB — COLOGUARD: COLOGUARD: NEGATIVE

## 2022-03-03 ENCOUNTER — Other Ambulatory Visit: Payer: Self-pay

## 2022-03-03 ENCOUNTER — Telehealth: Payer: Self-pay | Admitting: Internal Medicine

## 2022-03-03 DIAGNOSIS — Z6841 Body Mass Index (BMI) 40.0 and over, adult: Secondary | ICD-10-CM

## 2022-03-03 NOTE — Telephone Encounter (Signed)
Referral Request - Has patient seen PCP for this complaint? Yes.   ?*If NO, is insurance requiring patient see PCP for this issue before PCP can refer them? ?Referral for which specialty: [facility] ?Preferred provider/office: [phone] ?Reason for referral: pt needs referral for the weight loss sleeve  ?

## 2022-03-03 NOTE — Telephone Encounter (Signed)
Referral placed.

## 2022-04-07 ENCOUNTER — Other Ambulatory Visit: Payer: Self-pay | Admitting: Internal Medicine

## 2022-04-07 DIAGNOSIS — I10 Essential (primary) hypertension: Secondary | ICD-10-CM

## 2022-04-07 NOTE — Telephone Encounter (Signed)
Requested Prescriptions  ?Pending Prescriptions Disp Refills  ?? losartan (COZAAR) 100 MG tablet [Pharmacy Med Name: Losartan Potassium 100 MG Oral Tablet] 30 tablet 0  ?  Sig: Take 1 tablet by mouth once daily  ?  ? Cardiovascular:  Angiotensin Receptor Blockers Failed - 04/07/2022  9:04 AM  ?  ?  Failed - Cr in normal range and within 180 days  ?  Creatinine  ?Date Value Ref Range Status  ?04/18/2014 1.41 (H) 0.60 - 1.30 mg/dL Final  ? ?Creatinine, Ser  ?Date Value Ref Range Status  ?12/02/2021 1.37 (H) 0.76 - 1.27 mg/dL Final  ?   ?  ?  Passed - K in normal range and within 180 days  ?  Potassium  ?Date Value Ref Range Status  ?12/02/2021 4.2 3.5 - 5.2 mmol/L Final  ?04/18/2014 3.6 3.5 - 5.1 mmol/L Final  ?   ?  ?  Passed - Patient is not pregnant  ?  ?  Passed - Last BP in normal range  ?  BP Readings from Last 1 Encounters:  ?12/13/21 136/82  ?   ?  ?  Passed - Valid encounter within last 6 months  ?  Recent Outpatient Visits   ?      ? 3 months ago Viral URI with cough  ? [facility] Reubin Milan, MD  ? 4 months ago Annual physical exam  ? [facility] Reubin Milan, MD  ? 7 months ago Bladder infection  ? [facility] Reubin Milan, MD  ? 7 months ago Essential (primary) hypertension  ? [facility] Reubin Milan, MD  ? 1 year ago Acute non-recurrent sinusitis, unspecified location  ? [facility] Duanne Limerick, MD  ?  ?  ?Future Appointments   ?        ? In 2 months Judithann Graves Nyoka Cowden, MD [facility], [facility]  ? In 8 months Judithann Graves Nyoka Cowden, MD [facility], [facility]  ?  ? ?  ?  ?  ? ? ?

## 2022-04-14 ENCOUNTER — Other Ambulatory Visit: Payer: Self-pay | Admitting: Internal Medicine

## 2022-04-14 DIAGNOSIS — I10 Essential (primary) hypertension: Secondary | ICD-10-CM

## 2022-04-14 NOTE — Telephone Encounter (Signed)
Medication Refill - Medication: furosemide (LASIX) 20 MG tablet and amLODipine (NORVASC) 10 MG tablet ? ? ?Has the patient contacted their pharmacy? Yes.   ? ?(Agent: If yes, when and what did the pharmacy advise?) Contact PCP office due to no refills, patient states he will run out prior to appointment  ? ?Preferred Pharmacy (with phone number or street name):  ? ?[facility] ([address]), [address] Phone:  [phone]  ?Fax:  [phone]  ?  ? ? ?Has the patient been seen for an appointment in the last year OR does the patient have an upcoming appointment? Yes.   ? ?Agent: Please be advised that RX refills may take up to 3 business days. We ask that you follow-up with your pharmacy. ?

## 2022-04-16 ENCOUNTER — Other Ambulatory Visit: Payer: Self-pay | Admitting: Internal Medicine

## 2022-04-16 DIAGNOSIS — I10 Essential (primary) hypertension: Secondary | ICD-10-CM

## 2022-04-17 MED ORDER — FUROSEMIDE 20 MG PO TABS: 20.0000 mg | ORAL_TABLET | Freq: Every day | ORAL | 1 refills | Status: DC

## 2022-04-17 MED ORDER — AMLODIPINE BESYLATE 10 MG PO TABS: 10.0000 mg | ORAL_TABLET | Freq: Every day | ORAL | 2 refills | Status: DC

## 2022-04-17 NOTE — Telephone Encounter (Signed)
Requested Prescriptions  ?Pending Prescriptions Disp Refills  ?? furosemide (LASIX) 20 MG tablet 30 tablet 5  ?  Sig: Take 1 tablet (20 mg total) by mouth daily.  ?  ? Cardiovascular:  Diuretics - Loop Failed - 04/14/2022  3:28 PM  ?  ?  Failed - Cr in normal range and within 180 days  ?  Creatinine  ?Date Value Ref Range Status  ?04/18/2014 1.41 (H) 0.60 - 1.30 mg/dL Final  ? ?Creatinine, Ser  ?Date Value Ref Range Status  ?12/02/2021 1.37 (H) 0.76 - 1.27 mg/dL Final  ?   ?  ?  Failed - Mg Level in normal range and within 180 days  ?  No results found for: MG   ?  ?  Passed - K in normal range and within 180 days  ?  Potassium  ?Date Value Ref Range Status  ?12/02/2021 4.2 3.5 - 5.2 mmol/L Final  ?04/18/2014 3.6 3.5 - 5.1 mmol/L Final  ?   ?  ?  Passed - Ca in normal range and within 180 days  ?  Calcium  ?Date Value Ref Range Status  ?12/02/2021 9.5 8.7 - 10.2 mg/dL Final  ? ?Calcium, Total  ?Date Value Ref Range Status  ?04/18/2014 8.7 8.5 - 10.1 mg/dL Final  ?   ?  ?  Passed - Na in normal range and within 180 days  ?  Sodium  ?Date Value Ref Range Status  ?12/02/2021 144 134 - 144 mmol/L Final  ?04/18/2014 142 136 - 145 mmol/L Final  ?   ?  ?  Passed - Cl in normal range and within 180 days  ?  Chloride  ?Date Value Ref Range Status  ?12/02/2021 105 96 - 106 mmol/L Final  ?04/18/2014 106 98 - 107 mmol/L Final  ?   ?  ?  Passed - Last BP in normal range  ?  BP Readings from Last 1 Encounters:  ?12/13/21 136/82  ?   ?  ?  Passed - Valid encounter within last 6 months  ?  Recent Outpatient Visits   ?      ? 4 months ago Viral URI with cough  ? [facility] Reubin Milan, MD  ? 4 months ago Annual physical exam  ? [facility] Reubin Milan, MD  ? 7 months ago Bladder infection  ? [facility] Reubin Milan, MD  ? 8 months ago Essential (primary) hypertension  ? [facility] Reubin Milan, MD  ? 1 year ago Acute non-recurrent sinusitis, unspecified location  ?  [facility] Duanne Limerick, MD  ?  ?  ?Future Appointments   ?        ? In 1 month Judithann Graves Nyoka Cowden, MD [facility], [facility]  ? In 7 months Reubin Milan, MD [facility], [facility]  ?  ? ?  ?  ?  ?? amLODipine (NORVASC) 10 MG tablet 30 tablet 2  ?  Sig: Take 1 tablet (10 mg total) by mouth daily.  ?  ? Cardiovascular: Calcium Channel Blockers 2 Passed - 04/14/2022  3:28 PM  ?  ?  Passed - Last BP in normal range  ?  BP Readings from Last 1 Encounters:  ?12/13/21 136/82  ?   ?  ?  Passed - Last Heart Rate in normal range  ?  Pulse Readings from Last 1 Encounters:  ?12/13/21 96  ?   ?  ?  Passed - Valid encounter within last 6 months  ?  Recent Outpatient Visits   ?      ? 4 months ago Viral URI with cough  ? [facility] Reubin Milan, MD  ? 4 months ago Annual physical exam  ? [facility] Reubin Milan, MD  ? 7 months ago Bladder infection  ? [facility] Reubin Milan, MD  ? 8 months ago Essential (primary) hypertension  ? [facility] Reubin Milan, MD  ? 1 year ago Acute non-recurrent sinusitis, unspecified location  ? [facility] Duanne Limerick, MD  ?  ?  ?Future Appointments   ?        ? In 1 month Judithann Graves Nyoka Cowden, MD [facility], [facility]  ? In 7 months Judithann Graves Nyoka Cowden, MD [facility], [facility]  ?  ? ?  ?  ?  ? ?

## 2022-04-17 NOTE — Telephone Encounter (Signed)
Requested medications are due for refill today.  yes ? ?Requested medications are on the active medications list.  yes ? ?Last refill. 08/16/2021 #30 5 refills ? ?Future visit scheduled.   yes ? ?Notes to clinic.  Medication refill failed protocol due to expired and abnormal labs. ? ? ? ?Requested Prescriptions  ?Pending Prescriptions Disp Refills  ? furosemide (LASIX) 20 MG tablet 30 tablet 5  ?  Sig: Take 1 tablet (20 mg total) by mouth daily.  ?  ? Cardiovascular:  Diuretics - Loop Failed - 04/14/2022  3:28 PM  ?  ?  Failed - Cr in normal range and within 180 days  ?  Creatinine  ?Date Value Ref Range Status  ?04/18/2014 1.41 (H) 0.60 - 1.30 mg/dL Final  ? ?Creatinine, Ser  ?Date Value Ref Range Status  ?12/02/2021 1.37 (H) 0.76 - 1.27 mg/dL Final  ?  ?  ?  ?  Failed - Mg Level in normal range and within 180 days  ?  No results found for: MG  ?  ?  ?  Passed - K in normal range and within 180 days  ?  Potassium  ?Date Value Ref Range Status  ?12/02/2021 4.2 3.5 - 5.2 mmol/L Final  ?04/18/2014 3.6 3.5 - 5.1 mmol/L Final  ?  ?  ?  ?  Passed - Ca in normal range and within 180 days  ?  Calcium  ?Date Value Ref Range Status  ?12/02/2021 9.5 8.7 - 10.2 mg/dL Final  ? ?Calcium, Total  ?Date Value Ref Range Status  ?04/18/2014 8.7 8.5 - 10.1 mg/dL Final  ?  ?  ?  ?  Passed - Na in normal range and within 180 days  ?  Sodium  ?Date Value Ref Range Status  ?12/02/2021 144 134 - 144 mmol/L Final  ?04/18/2014 142 136 - 145 mmol/L Final  ?  ?  ?  ?  Passed - Cl in normal range and within 180 days  ?  Chloride  ?Date Value Ref Range Status  ?12/02/2021 105 96 - 106 mmol/L Final  ?04/18/2014 106 98 - 107 mmol/L Final  ?  ?  ?  ?  Passed - Last BP in normal range  ?  BP Readings from Last 1 Encounters:  ?12/13/21 136/82  ?  ?  ?  ?  Passed - Valid encounter within last 6 months  ?  Recent Outpatient Visits   ? ?      ? 4 months ago Viral URI with cough  ? [facility] Reubin Milan, MD  ? 4 months ago Annual physical  exam  ? [facility] Reubin Milan, MD  ? 7 months ago Bladder infection  ? [facility] Reubin Milan, MD  ? 8 months ago Essential (primary) hypertension  ? [facility] Reubin Milan, MD  ? 1 year ago Acute non-recurrent sinusitis, unspecified location  ? [facility] Duanne Limerick, MD  ? ?  ?  ?Future Appointments   ? ?        ? In 1 month Judithann Graves Nyoka Cowden, MD [facility], [facility]  ? In 7 months Judithann Graves Nyoka Cowden, MD [facility], [facility]  ? ?  ? ? ?  ?  ?  ?Signed Prescriptions Disp Refills  ? amLODipine (NORVASC) 10 MG tablet 30 tablet 2  ?  Sig: Take 1 tablet (10 mg total) by mouth daily.  ?  ?  Cardiovascular: Calcium Channel Blockers 2 Passed - 04/14/2022  3:28 PM  ?  ?  Passed - Last BP in normal range  ?  BP Readings from Last 1 Encounters:  ?12/13/21 136/82  ?  ?  ?  ?  Passed - Last Heart Rate in normal range  ?  Pulse Readings from Last 1 Encounters:  ?12/13/21 96  ?  ?  ?  ?  Passed - Valid encounter within last 6 months  ?  Recent Outpatient Visits   ? ?      ? 4 months ago Viral URI with cough  ? [facility] Reubin Milan, MD  ? 4 months ago Annual physical exam  ? [facility] Reubin Milan, MD  ? 7 months ago Bladder infection  ? [facility] Reubin Milan, MD  ? 8 months ago Essential (primary) hypertension  ? [facility] Reubin Milan, MD  ? 1 year ago Acute non-recurrent sinusitis, unspecified location  ? [facility] Duanne Limerick, MD  ? ?  ?  ?Future Appointments   ? ?        ? In 1 month Judithann Graves Nyoka Cowden, MD [facility], [facility]  ? In 7 months Judithann Graves Nyoka Cowden, MD [facility], [facility]  ? ?  ? ? ?  ?  ?  ?  ?

## 2022-05-29 LAB — BASIC METABOLIC PANEL
BUN: 23 — AB (ref 4–21)
CO2: 28 — AB (ref 13–22)
Chloride: 106 (ref 99–108)
Creatinine: 1.2 (ref 0.6–1.3)
Glucose: 130
Potassium: 4.1 mEq/L (ref 3.5–5.1)
Sodium: 143 (ref 137–147)

## 2022-05-29 LAB — CBC AND DIFFERENTIAL
HCT: 34 — AB (ref 41–53)
Hemoglobin: 12 — AB (ref 13.5–17.5)
Platelets: 105 10*3/uL — AB (ref 150–400)
WBC: 10.2

## 2022-05-29 LAB — HEPATIC FUNCTION PANEL
ALT: 24 U/L (ref 10–40)
AST: 12 — AB (ref 14–40)
Alkaline Phosphatase: 64 (ref 25–125)

## 2022-05-29 LAB — LIPID PANEL
Cholesterol: 166 (ref 0–200)
HDL: 43 (ref 35–70)
LDL Cholesterol: 101
Triglycerides: 110 (ref 40–160)

## 2022-05-29 LAB — COMPREHENSIVE METABOLIC PANEL: eGFR: 71

## 2022-05-29 LAB — HEMOGLOBIN A1C: Hemoglobin A1C: 5.1

## 2022-05-29 LAB — IRON,TIBC AND FERRITIN PANEL: Ferritin: 84

## 2022-05-29 LAB — TSH: TSH: 2.02 (ref 0.41–5.90)

## 2022-05-29 LAB — VITAMIN B12: Vitamin B-12: 432

## 2022-05-29 LAB — VITAMIN D 25 HYDROXY (VIT D DEFICIENCY, FRACTURES): Vit D, 25-Hydroxy: 17.5

## 2022-06-07 ENCOUNTER — Other Ambulatory Visit: Payer: Self-pay | Admitting: Internal Medicine

## 2022-06-07 ENCOUNTER — Ambulatory Visit: Payer: PRIVATE HEALTH INSURANCE | Admitting: Internal Medicine

## 2022-06-07 DIAGNOSIS — I10 Essential (primary) hypertension: Secondary | ICD-10-CM

## 2022-06-07 NOTE — Telephone Encounter (Signed)
Copied from CRM [id number]. Topic: General - Other >> Jun 07, 2022 12:35 PM Everette C wrote: Reason for CRM: Medication Refill - Medication: furosemide (LASIX) 20 MG tablet [[id number]]   Has the patient contacted their pharmacy? No. The patient was uncertain of the medication's status pending their upcoming appointment  (Agent: If no, request that the patient contact the pharmacy for the refill. If patient does not wish to contact the pharmacy document the reason why and proceed with request.) (Agent: If yes, when and what did the pharmacy advise?)  Preferred Pharmacy (with phone number or street name): [facility] [address] ([address]),  - [address]) [place] [address] Phone: [phone] Fax: [phone] Hours: Not open 24 hours  Has the patient been seen for an appointment in the last year OR does the patient have an upcoming appointment? Yes.    Agent: Please be advised that RX refills may take up to 3 business days. We ask that you follow-up with your pharmacy.

## 2022-06-08 MED ORDER — FUROSEMIDE 20 MG PO TABS: 20.0000 mg | ORAL_TABLET | Freq: Every day | ORAL | 1 refills | Status: DC

## 2022-06-08 NOTE — Telephone Encounter (Signed)
Requested medications are due for refill today.  yes  Requested medications are on the active medications list.  yes  Last refill. 04/17/2022 #30 1 refill  Future visit scheduled.   yes  Notes to clinic.  Refill failed protocol d/t expired labs.    Requested Prescriptions  Pending Prescriptions Disp Refills   furosemide (LASIX) 20 MG tablet 30 tablet 1    Sig: Take 1 tablet (20 mg total) by mouth daily.     Cardiovascular:  Diuretics - Loop Failed - 06/07/2022  2:53 PM      Failed - K in normal range and within 180 days    Potassium  Date Value Ref Range Status  12/02/2021 4.2 3.5 - 5.2 mmol/L Final  04/18/2014 3.6 3.5 - 5.1 mmol/L Final         Failed - Ca in normal range and within 180 days    Calcium  Date Value Ref Range Status  12/02/2021 9.5 8.7 - 10.2 mg/dL Final   Calcium, Total  Date Value Ref Range Status  04/18/2014 8.7 8.5 - 10.1 mg/dL Final         Failed - Na in normal range and within 180 days    Sodium  Date Value Ref Range Status  12/02/2021 144 134 - 144 mmol/L Final  04/18/2014 142 136 - 145 mmol/L Final         Failed - Cr in normal range and within 180 days    Creatinine  Date Value Ref Range Status  04/18/2014 1.41 (H) 0.60 - 1.30 mg/dL Final   Creatinine, Ser  Date Value Ref Range Status  12/02/2021 1.37 (H) 0.76 - 1.27 mg/dL Final         Failed - Cl in normal range and within 180 days    Chloride  Date Value Ref Range Status  12/02/2021 105 96 - 106 mmol/L Final  04/18/2014 106 98 - 107 mmol/L Final         Failed - Mg Level in normal range and within 180 days    No results found for: "MG"       Passed - Last BP in normal range    BP Readings from Last 1 Encounters:  12/13/21 136/82         Passed - Valid encounter within last 6 months    Recent Outpatient Visits           5 months ago Viral URI with cough   [facility] Reubin Milan, MD   6 months ago Annual physical exam   [facility] Reubin Milan, MD   9 months ago Bladder infection   [facility] Reubin Milan, MD   9 months ago Essential (primary) hypertension   [facility] Reubin Milan, MD   1 year ago Acute non-recurrent sinusitis, unspecified location   [facility] Duanne Limerick, MD       Future Appointments             In 4 days Reubin Milan, MD [facility], [facility]   In 6 months Judithann Graves Nyoka Cowden, MD [facility]

## 2022-06-12 ENCOUNTER — Encounter: Payer: Self-pay | Admitting: Internal Medicine

## 2022-06-12 ENCOUNTER — Ambulatory Visit: Payer: PRIVATE HEALTH INSURANCE | Admitting: Internal Medicine

## 2022-06-12 VITALS — BP 136/78 | HR 77 | Ht 72.0 in | Wt 383.0 lb

## 2022-06-12 DIAGNOSIS — I10 Essential (primary) hypertension: Secondary | ICD-10-CM

## 2022-06-12 DIAGNOSIS — N1831 Chronic kidney disease, stage 3a: Secondary | ICD-10-CM

## 2022-06-12 DIAGNOSIS — I480 Paroxysmal atrial fibrillation: Secondary | ICD-10-CM

## 2022-06-12 DIAGNOSIS — R7303 Prediabetes: Secondary | ICD-10-CM

## 2022-06-12 DIAGNOSIS — E559 Vitamin D deficiency, unspecified: Secondary | ICD-10-CM

## 2022-06-12 DIAGNOSIS — D6869 Other thrombophilia: Secondary | ICD-10-CM | POA: Insufficient documentation

## 2022-06-12 DIAGNOSIS — Z6841 Body Mass Index (BMI) 40.0 and over, adult: Secondary | ICD-10-CM

## 2022-06-12 MED ORDER — AMLODIPINE BESYLATE 10 MG PO TABS: 10.0000 mg | ORAL_TABLET | Freq: Every day | ORAL | 5 refills | Status: DC

## 2022-06-19 ENCOUNTER — Other Ambulatory Visit: Payer: Self-pay | Admitting: Internal Medicine

## 2022-06-19 DIAGNOSIS — I10 Essential (primary) hypertension: Secondary | ICD-10-CM

## 2022-06-21 NOTE — Telephone Encounter (Signed)
Requested Prescriptions  Pending Prescriptions Disp Refills  . carvedilol (COREG) 25 MG tablet [Pharmacy Med Name: Carvedilol 25 MG Oral Tablet] 180 tablet 1    Sig: Take 1 tablet by mouth twice daily     Cardiovascular: Beta Blockers 3 Failed - 06/19/2022  6:44 PM      Failed - AST in normal range and within 360 days    AST  Date Value Ref Range Status  05/29/2022 12 (A) 14 - 40 Final         Passed - Cr in normal range and within 360 days    Creatinine  Date Value Ref Range Status  05/29/2022 1.2 0.6 - 1.3 Final  04/18/2014 1.41 (H) 0.60 - 1.30 mg/dL Final   Creatinine, Ser  Date Value Ref Range Status  12/02/2021 1.37 (H) 0.76 - 1.27 mg/dL Final         Passed - ALT in normal range and within 360 days    ALT  Date Value Ref Range Status  05/29/2022 24 10 - 40 U/L Final         Passed - Last BP in normal range    BP Readings from Last 1 Encounters:  06/12/22 136/78         Passed - Last Heart Rate in normal range    Pulse Readings from Last 1 Encounters:  06/12/22 77         Passed - Valid encounter within last 6 months    Recent Outpatient Visits          1 week ago Essential (primary) hypertension   [facility] Reubin Milan, MD   6 months ago Viral URI with cough   [facility] Reubin Milan, MD   6 months ago Annual physical exam   [facility] Reubin Milan, MD   9 months ago Bladder infection   [facility] Reubin Milan, MD   10 months ago Essential (primary) hypertension   [facility] Reubin Milan, MD      Future Appointments            In 5 months Judithann Graves, Nyoka Cowden, MD [facility], [facility]

## 2022-10-02 ENCOUNTER — Other Ambulatory Visit: Payer: Self-pay | Admitting: Internal Medicine

## 2022-10-02 DIAGNOSIS — I10 Essential (primary) hypertension: Secondary | ICD-10-CM

## 2022-10-03 NOTE — Telephone Encounter (Signed)
Requested Prescriptions  Pending Prescriptions Disp Refills  . furosemide (LASIX) 20 MG tablet [Pharmacy Med Name: Furosemide 20 MG Oral Tablet] 90 tablet 0    Sig: Take 1 tablet by mouth once daily     Cardiovascular:  Diuretics - Loop Failed - 10/02/2022  9:14 AM      Failed - Ca in normal range and within 180 days    Calcium  Date Value Ref Range Status  12/02/2021 9.5 8.7 - 10.2 mg/dL Final   Calcium, Total  Date Value Ref Range Status  04/18/2014 8.7 8.5 - 10.1 mg/dL Final         Failed - Mg Level in normal range and within 180 days    No results found for: "MG"       Passed - K in normal range and within 180 days    Potassium  Date Value Ref Range Status  05/29/2022 4.1 3.5 - 5.1 mEq/L Final  04/18/2014 3.6 3.5 - 5.1 mmol/L Final         Passed - Na in normal range and within 180 days    Sodium  Date Value Ref Range Status  05/29/2022 143 137 - 147 Final  04/18/2014 142 136 - 145 mmol/L Final         Passed - Cr in normal range and within 180 days    Creatinine  Date Value Ref Range Status  05/29/2022 1.2 0.6 - 1.3 Final  04/18/2014 1.41 (H) 0.60 - 1.30 mg/dL Final   Creatinine, Ser  Date Value Ref Range Status  12/02/2021 1.37 (H) 0.76 - 1.27 mg/dL Final         Passed - Cl in normal range and within 180 days    Chloride  Date Value Ref Range Status  05/29/2022 106 99 - 108 Final  04/18/2014 106 98 - 107 mmol/L Final         Passed - Last BP in normal range    BP Readings from Last 1 Encounters:  06/12/22 136/78         Passed - Valid encounter within last 6 months    Recent Outpatient Visits          3 months ago Essential (primary) hypertension   [facility] [place] [facility] at [facility], Jesse Sans, MD   9 months ago Viral URI with cough   [place] [facility], Jesse Sans, MD   10 months ago Annual physical exam   [place] [facility] at  [facility], Jesse Sans, MD   1 year ago Bladder infection   [place] [facility] at [facility], Jesse Sans, MD   1 year ago Essential (primary) hypertension   [place] [facility] at [facility] Jesse Sans, MD      Future Appointments            In 2 months Army Melia, Jesse Sans, MD [facility] at [facility], [facility]

## 2022-11-06 ENCOUNTER — Ambulatory Visit: Payer: Self-pay

## 2022-11-06 NOTE — Telephone Encounter (Signed)
  Chief Complaint: cough  Symptoms: cough, congestion  Frequency: last Thursday  Pertinent Negatives: Patient denies SOB or fever  Disposition: [] ED /[] Urgent Care (no appt availability in office) / [] Appointment(In office/virtual)/ []  [place] [facility]/ [] Home Care/ [] Refused Recommended Disposition /[] [place] [facility]/ [x]  Follow-up with PCP Additional Notes: pt asking for Dr. to send in Promethazine-DM 6.25-15 MG/5ML. States he has these sx every year and was prescribed that and it really helped. Pt did telehealth appt with job Friday and got Zpac but wouldn't send in that medication. Pt preferred to not do a VV if he doesn't have to. Advised I would send message back and someone will FU with him if needed.   Summary: requesting cough med   Pt called in for assistance. Pt says that he was seen via telehealth on his job and prescribed a zpak. Pt says every year this time he has to see Dr. due to bronchitis. Advised pt of PCP next opening. Pt would like to know if possible could provider send something in for a cough?  Please assist further.   Pharmacy: [facility] [address] [place]), 02-19-1993 - [address] Phone: [phone] Fax: [phone]       Reason for Disposition  [1] Continuous (nonstop) coughing interferes with work or school AND [2] no improvement using cough treatment per Care Advice  Answer Assessment - Initial Assessment Questions 1. ONSET: "When did the nasal discharge start?"      Thursday  3. COUGH: "Do you have a cough?" If Yes, ask: "Describe the color of your sputum" (clear, white, yellow, green)     yes 4. RESPIRATORY DISTRESS: "Describe your breathing."      Mild at times  5. FEVER: "Do you have a fever?" If Yes, ask: "What is your temperature, how was it measured, and when did it start?"     no 6. SEVERITY: "Overall, how bad are you feeling right now?" (e.g., doesn't interfere with normal activities, staying  home from school/work, staying in bed)      Interferes with activities  7. OTHER SYMPTOMS: "Do you have any other symptoms?" (e.g., sore throat, earache, wheezing, vomiting)     Cough, congestion  Protocols used: Common Cold-A-AH, Cough - Acute Productive-A-AH

## 2022-11-06 NOTE — Telephone Encounter (Signed)
Please call patient to schedule for tomorrow at 11:20 AM for same day with Dr Judithann Graves.   Thanks!

## 2022-11-07 ENCOUNTER — Ambulatory Visit (INDEPENDENT_AMBULATORY_CARE_PROVIDER_SITE_OTHER): Payer: PRIVATE HEALTH INSURANCE | Admitting: Internal Medicine

## 2022-11-07 ENCOUNTER — Encounter: Payer: Self-pay | Admitting: Internal Medicine

## 2022-11-07 ENCOUNTER — Ambulatory Visit: Payer: PRIVATE HEALTH INSURANCE | Admitting: Internal Medicine

## 2022-11-07 VITALS — BP 136/78 | HR 85 | Temp 98.5°F | Ht 72.0 in | Wt 366.0 lb

## 2022-11-07 DIAGNOSIS — Z87891 Personal history of nicotine dependence: Secondary | ICD-10-CM

## 2022-11-07 DIAGNOSIS — I10 Essential (primary) hypertension: Secondary | ICD-10-CM

## 2022-11-07 DIAGNOSIS — Z6841 Body Mass Index (BMI) 40.0 and over, adult: Secondary | ICD-10-CM

## 2022-11-07 DIAGNOSIS — I4891 Unspecified atrial fibrillation: Secondary | ICD-10-CM | POA: Diagnosis not present

## 2022-11-07 DIAGNOSIS — J069 Acute upper respiratory infection, unspecified: Secondary | ICD-10-CM

## 2022-11-07 DIAGNOSIS — Z23 Encounter for immunization: Secondary | ICD-10-CM

## 2022-11-07 DIAGNOSIS — R051 Acute cough: Secondary | ICD-10-CM | POA: Diagnosis not present

## 2022-11-07 DIAGNOSIS — Z7901 Long term (current) use of anticoagulants: Secondary | ICD-10-CM

## 2022-11-07 MED ORDER — APIXABAN 5 MG PO TABS: 5.0000 mg | ORAL_TABLET | Freq: Two times a day (BID) | ORAL | 5 refills | Status: DC

## 2022-11-07 MED ORDER — PROMETHAZINE-DM 6.25-15 MG/5ML PO SYRP: 5.0000 mL | ORAL_SOLUTION | Freq: Four times a day (QID) | ORAL | 0 refills | Status: AC | PRN

## 2022-11-07 NOTE — Progress Notes (Unsigned)
Date:  11/07/2022   Name:  Bradley Love   DOB:  1966-11-05   MRN:  [id number]   Chief Complaint: Cough  Cough This is a new problem. Episode onset: X6 days. The problem has been rapidly worsening. The problem occurs constantly. The cough is Productive of sputum. Pertinent negatives include no chest pain, chills, fever, shortness of breath or wheezing. Treatments tried: zpak, tessolon, coricidin. The treatment provided moderate (but still coughing at night) relief.     Lab Results  Component Value Date   NA 143 05/29/2022   K 4.1 05/29/2022   CO2 28 (A) 05/29/2022   GLUCOSE 138 (H) 12/02/2021   BUN 23 (A) 05/29/2022   CREATININE 1.2 05/29/2022   CALCIUM 9.5 12/02/2021   EGFR 71 05/29/2022   GFRNONAA 59 (L) 11/29/2020   Lab Results  Component Value Date   CHOL 166 05/29/2022   HDL 43 05/29/2022   LDLCALC 101 05/29/2022   TRIG 110 05/29/2022   CHOLHDL 4.7 12/02/2021   Lab Results  Component Value Date   TSH 2.02 05/29/2022   Lab Results  Component Value Date   HGBA1C 5.1 05/29/2022   Lab Results  Component Value Date   WBC 10.2 05/29/2022   HGB 12.0 (A) 05/29/2022   HCT 34 (A) 05/29/2022   MCV 68 (L) 12/02/2021   PLT 105 (A) 05/29/2022   Lab Results  Component Value Date   ALT 24 05/29/2022   AST 12 (A) 05/29/2022   ALKPHOS 64 05/29/2022   BILITOT 1.4 (H) 12/02/2021   Lab Results  Component Value Date   VD25OH 17.5 05/29/2022     Review of Systems  Constitutional:  Negative for chills, fatigue and fever.  Respiratory:  Positive for cough. Negative for chest tightness, shortness of breath and wheezing.   Cardiovascular:  Negative for chest pain.  Psychiatric/Behavioral:  Negative for dysphoric mood and sleep disturbance. The patient is not nervous/anxious.     Patient Active Problem List   Diagnosis Date Noted   Vitamin D deficiency 06/12/2022   Acquired thrombophilia ([place]) 06/12/2022   Paroxysmal atrial fibrillation ([place]) 12/02/2021    Mild hyperlipidemia 12/02/2021   CSF leak 03/17/2021   Chronic renal failure (CRF), stage 3a ([place]) 11/29/2020   Rhinorrhea 10/28/2019   Obstructive sleep apnea syndrome 02/07/2019   Family history of sickle cell trait 10/31/2017   Morbid obesity with BMI of 45.0-49.9, adult ([place]) 09/13/2016   Absolute anemia 09/13/2016   Essential (primary) hypertension 07/27/2015   Encounter for screening for malignant neoplasm of prostate 07/27/2015    No Known Allergies  Past Surgical History:  Procedure Laterality Date   CHOLECYSTECTOMY  12/18/2000   CORNEAL TRANSPLANT Right 12/18/2008   SINUS EXPLORATION Bilateral 03/2021   for CSF leak    Social History   Tobacco Use   Smoking status: Former    Types: Cigarettes    Quit date: 1997    Years since quitting: 26.9   Smokeless tobacco: Never  Vaping Use   Vaping Use: Never used  Substance Use Topics   Alcohol use: Yes    Alcohol/week: 0.0 standard drinks of alcohol    Comment: occasionally -3-4x/yr   Drug use: No     Medication list has been reviewed and updated.  Current Meds  Medication Sig   albuterol (VENTOLIN HFA) 108 (90 Base) MCG/ACT inhaler Inhale 2 puffs into the lungs 4 (four) times daily as needed.   amLODipine (NORVASC) 10 MG tablet Take 1 tablet (  10 mg total) by mouth daily.   apixaban (ELIQUIS) 5 MG TABS tablet Take by mouth.   aspirin 81 MG tablet Take 1 tablet by mouth daily.   benzonatate (TESSALON) 200 MG capsule Take 400 mg by mouth every 8 (eight) hours as needed.   carvedilol (COREG) 25 MG tablet Take 1 tablet by mouth twice daily   diltiazem (CARDIZEM) 30 MG tablet Take 60 mg by mouth 2 (two) times daily as needed.   furosemide (LASIX) 20 MG tablet Take 1 tablet by mouth once daily   losartan (COZAAR) 100 MG tablet Take 1 tablet by mouth once daily   Multiple Vitamins-Minerals (MULTIVITAL PO) Take by mouth daily.   Omega 3 1200 MG CAPS Take 1 capsule by mouth daily.   potassium chloride (MICRO-K) 10 MEQ CR  capsule Take 1 capsule by mouth daily. Reported on 12/29/2015   sildenafil (VIAGRA) 50 MG tablet Take 0.5 tablets (25 mg total) by mouth daily as needed for erectile dysfunction.   spironolactone (ALDACTONE) 25 MG tablet Take 25 mg by mouth daily.   traMADol (ULTRAM) 50 MG tablet Take by mouth every 6 (six) hours as needed.   UNABLE TO FIND Med Name: CPAP @@ 4 cm H2O       11/07/2022    3:55 PM 06/12/2022    3:24 PM 12/13/2021    1:25 PM 12/02/2021    9:31 AM  GAD 7 : Generalized Anxiety Score  Nervous, Anxious, on Edge 0 0 0 0  Control/stop worrying 0 0 0 0  Worry too much - different things 0 1 0 1  Trouble relaxing 0 1 1 0  Restless 0 1 0 0  Easily annoyed or irritable 0 0 0 0  Afraid - awful might happen 0 0 0 0  Total GAD 7 Score 0 _0 Anxiety Difficulty Not difficult at all Not difficult at all Not difficult at all Not difficult at all       11/07/2022    3:55 PM 06/12/2022    3:24 PM 12/13/2021    1:24 PM  Depression screen PHQ 2/9  Decreased Interest 0 0 0  Down, Depressed, Hopeless 0 0 0  PHQ - 2 Score 0 0 0  Altered sleeping 0 0 0  Tired, decreased energy 0 0 1  Change in appetite 0 0 1  Feeling bad or failure about yourself  0 0 0  Trouble concentrating 0 0 0  Moving slowly or fidgety/restless 0 0 0  Suicidal thoughts 0 0 0  PHQ-9 Score 0 0 2  Difficult doing work/chores Not difficult at all Not difficult at all Not difficult at all    BP Readings from Last 3 Encounters:  11/07/22 (!) 156/90  06/12/22 136/78  12/13/21 136/82    Physical Exam  Wt Readings from Last 3 Encounters:  11/07/22 (!) 366 lb (166 kg)  06/12/22 (!) 383 lb (173.7 kg)  12/13/21 (!) 351 lb (159.2 kg)    BP (!) 156/90 (BP Location: Left Arm)   Pulse 85   Temp 98.5 F (36.9 C) (Oral)   Ht 6' (1.829 m)   Wt (!) 366 lb (166 kg)   SpO2 95%   BMI 49.64 kg/m   Assessment and Plan: 1. Viral upper respiratory tract infection He has completed a Zpak but is still coughing at  night. Will send in promethazine- DM.  2. Acute cough - promethazine-dextromethorphan (PROMETHAZINE-DM) 6.25-15 MG/5ML syrup; Take 5 mLs by mouth 4 (four)  times daily as needed for up to 9 days for cough.  Dispense: 118 mL; Refill: 0  3. Essential (primary) hypertension Clinically stable exam with fairly well controlled BP. Tolerating medications without side effects at this time. Pt to continue current regimen and low sodium diet; benefits of regular exercise as able discussed.  4. Atrial fibrillation, unspecified type ([place]) In SR today; continue Eliquis bid - apixaban (ELIQUIS) 5 MG TABS tablet; Take 1 tablet (5 mg total) by mouth 2 (two) times daily.  Dispense: 60 tablet; Refill: 5  5. Morbid obesity with BMI of 45.0-49.9, adult [facility]) Planning Gastric sleeve in late January at [facility]   Partially dictated using [employer]. Any errors are unintentional.  Halina Maidens, MD [facility]  11/07/2022

## 2022-12-05 ENCOUNTER — Encounter: Payer: PRIVATE HEALTH INSURANCE | Admitting: Internal Medicine

## 2023-01-03 HISTORY — PX: LAPAROSCOPIC GASTRIC SLEEVE RESECTION: SHX5895

## 2023-01-23 ENCOUNTER — Other Ambulatory Visit: Payer: Self-pay | Admitting: Internal Medicine

## 2023-01-23 DIAGNOSIS — I10 Essential (primary) hypertension: Secondary | ICD-10-CM

## 2023-03-01 ENCOUNTER — Encounter: Payer: PRIVATE HEALTH INSURANCE | Admitting: Internal Medicine

## 2023-03-09 ENCOUNTER — Ambulatory Visit (INDEPENDENT_AMBULATORY_CARE_PROVIDER_SITE_OTHER): Payer: PRIVATE HEALTH INSURANCE | Admitting: Internal Medicine

## 2023-03-09 ENCOUNTER — Encounter: Payer: Self-pay | Admitting: Internal Medicine

## 2023-03-09 VITALS — BP 128/70 | HR 72 | Ht 72.0 in | Wt 291.0 lb

## 2023-03-09 DIAGNOSIS — I10 Essential (primary) hypertension: Secondary | ICD-10-CM

## 2023-03-09 DIAGNOSIS — I480 Paroxysmal atrial fibrillation: Secondary | ICD-10-CM | POA: Diagnosis not present

## 2023-03-09 DIAGNOSIS — Z903 Acquired absence of stomach [part of]: Secondary | ICD-10-CM

## 2023-03-09 MED ORDER — CARVEDILOL 25 MG PO TABS: 25.0000 mg | ORAL_TABLET | Freq: Two times a day (BID) | ORAL | 1 refills | Status: DC

## 2023-03-09 MED ORDER — AMLODIPINE BESYLATE 10 MG PO TABS: 10.0000 mg | ORAL_TABLET | Freq: Every day | ORAL | 5 refills | Status: DC

## 2023-03-09 MED ORDER — APIXABAN 5 MG PO TABS: 5.0000 mg | ORAL_TABLET | Freq: Two times a day (BID) | ORAL | 5 refills | Status: AC

## 2023-03-09 MED ORDER — FUROSEMIDE 20 MG PO TABS: 20.0000 mg | ORAL_TABLET | Freq: Every day | ORAL | 0 refills | Status: DC

## 2023-03-09 MED ORDER — LOSARTAN POTASSIUM 100 MG PO TABS: 100.0000 mg | ORAL_TABLET | Freq: Every day | ORAL | 1 refills | Status: DC

## 2023-03-09 NOTE — Assessment & Plan Note (Signed)
CHADSVaSC score = 1 He was recommended to continue Eliquis by Cardiology. Will refill medications today

## 2023-03-09 NOTE — Progress Notes (Signed)
Date:  03/09/2023   Name:  Bradley Love   DOB:  November 25, 1966   MRN:  [id number]   Chief Complaint: Hypertension  Hypertension This is a chronic problem. The problem is controlled. Pertinent negatives include no chest pain, headaches, palpitations or shortness of breath. Past treatments include beta blockers, diuretics, calcium channel blockers and angiotensin blockers. The current treatment provides significant improvement. There are no compliance problems.  There is no history of kidney disease, CAD/MI or CVA.   Paroxysmal Afib - he had one incidence of Afib and follow up Zio showed none.  He was placed on Eliquis but it was held for gastric surgery.  He is not sure if he should resume it.   He states he will be able to detect a recurrent episode.    CHA2DS2/VAS Stroke Risk Points  Current as of 11 minutes ago     1 >= 2 Points: High Risk  1 - 1.99 Points: Medium Risk  0 Points: Low Risk    Last Change: N/A      Details    This score determines the patient's risk of having a stroke if the  patient has atrial fibrillation.       Points Metrics  0 Has Congestive Heart Failure:  No    Current as of 11 minutes ago  0 Has Vascular Disease:  No    Current as of 11 minutes ago  1 Has Hypertension:  Yes    Current as of 11 minutes ago  0 Age:  57    Current as of 11 minutes ago  0 Has Diabetes:  No    Current as of 11 minutes ago  0 Had Stroke:  No  Had TIA:  No  Had Thromboembolism:  No    Current as of 11 minutes ago  0 Male:  No    Current as of 11 minutes ago        Lab Results  Component Value Date   NA 143 05/29/2022   K 4.1 05/29/2022   CO2 28 (A) 05/29/2022   GLUCOSE 138 (H) 12/02/2021   BUN 23 (A) 05/29/2022   CREATININE 1.2 05/29/2022   CALCIUM 9.5 12/02/2021   EGFR 71 05/29/2022   GFRNONAA 59 (L) 11/29/2020   Lab Results  Component Value Date   CHOL 166 05/29/2022   HDL 43 05/29/2022   LDLCALC 101 05/29/2022   TRIG 110 05/29/2022   CHOLHDL 4.7  12/02/2021   Lab Results  Component Value Date   TSH 2.02 05/29/2022   Lab Results  Component Value Date   HGBA1C 5.1 05/29/2022   Lab Results  Component Value Date   WBC 10.2 05/29/2022   HGB 12.0 (A) 05/29/2022   HCT 34 (A) 05/29/2022   MCV 68 (L) 12/02/2021   PLT 105 (A) 05/29/2022   Lab Results  Component Value Date   ALT 24 05/29/2022   AST 12 (A) 05/29/2022   ALKPHOS 64 05/29/2022   BILITOT 1.4 (H) 12/02/2021   Lab Results  Component Value Date   VD25OH 17.5 05/29/2022     Review of Systems  Constitutional:  Positive for unexpected weight change (50 lbs since surgery). Negative for fatigue.  HENT:  Negative for nosebleeds.   Eyes:  Negative for visual disturbance.  Respiratory:  Negative for cough, chest tightness, shortness of breath and wheezing.   Cardiovascular:  Negative for chest pain, palpitations and leg swelling.  Gastrointestinal:  Negative for abdominal pain, constipation  and diarrhea.  Neurological:  Negative for dizziness, weakness, light-headedness and headaches.  Psychiatric/Behavioral:  Negative for dysphoric mood and sleep disturbance. The patient is not nervous/anxious.     Patient Active Problem List   Diagnosis Date Noted   Vitamin D deficiency 06/12/2022   Acquired thrombophilia ([place]) 06/12/2022   Paroxysmal atrial fibrillation ([place]) 12/02/2021   Mild hyperlipidemia 12/02/2021   CSF leak 03/17/2021   Chronic renal failure (CRF), stage 3a ([place]) 11/29/2020   Rhinorrhea 10/28/2019   Obstructive sleep apnea syndrome 02/07/2019   Family history of sickle cell trait 10/31/2017   Morbid obesity with BMI of 45.0-49.9, adult ([place]) 09/13/2016   Absolute anemia 09/13/2016   Essential (primary) hypertension 07/27/2015   Encounter for screening for malignant neoplasm of prostate 07/27/2015    No Known Allergies  Past Surgical History:  Procedure Laterality Date   CHOLECYSTECTOMY  12/18/2000   CORNEAL TRANSPLANT Right 12/18/2008    LAPAROSCOPIC GASTRIC SLEEVE RESECTION  01/03/2023   SINUS EXPLORATION Bilateral 03/2021   for CSF leak    Social History   Tobacco Use   Smoking status: Former    Types: Cigarettes    Quit date: 1997    Years since quitting: 27.2   Smokeless tobacco: Never  Vaping Use   Vaping Use: Never used  Substance Use Topics   Alcohol use: Yes    Alcohol/week: 0.0 standard drinks of alcohol    Comment: occasionally -3-4x/yr   Drug use: No     Medication list has been reviewed and updated.  Current Meds  Medication Sig   albuterol (VENTOLIN HFA) 108 (90 Base) MCG/ACT inhaler Inhale 2 puffs into the lungs 4 (four) times daily as needed.   Multiple Vitamins-Minerals (MULTIVITAL PO) Take by mouth daily.   Omega 3 1200 MG CAPS Take 1 capsule by mouth daily.   sildenafil (VIAGRA) 50 MG tablet Take 0.5 tablets (25 mg total) by mouth daily as needed for erectile dysfunction.   UNABLE TO FIND Med Name: CPAP @@ 4 cm H2O   [DISCONTINUED] amLODipine (NORVASC) 10 MG tablet Take 1 tablet (10 mg total) by mouth daily.   [DISCONTINUED] carvedilol (COREG) 25 MG tablet Take 1 tablet by mouth twice daily   [DISCONTINUED] furosemide (LASIX) 20 MG tablet Take 1 tablet by mouth once daily   [DISCONTINUED] losartan (COZAAR) 100 MG tablet Take 1 tablet by mouth once daily   [DISCONTINUED] methocarbamol (ROBAXIN) 500 MG tablet Take 1,000 mg by mouth 2 (two) times daily.   [DISCONTINUED] omeprazole (PRILOSEC) 20 MG capsule Take 20 mg by mouth daily.       03/09/2023    1:33 PM 11/07/2022    3:55 PM 06/12/2022    3:24 PM 12/13/2021    1:25 PM  GAD 7 : Generalized Anxiety Score  Nervous, Anxious, on Edge 0 0 0 0  Control/stop worrying 0 0 0 0  Worry too much - different things 0 0 1 0  Trouble relaxing 0 0 1 1  Restless 0 0 1 0  Easily annoyed or irritable 0 0 0 0  Afraid - awful might happen 0 0 0 0  Total GAD 7 Score 0 0 3 1  Anxiety Difficulty Not difficult at all Not difficult at all Not difficult  at all Not difficult at all       03/09/2023    1:32 PM 11/07/2022    3:55 PM 06/12/2022    3:24 PM  Depression screen PHQ 2/9  Decreased Interest 0 0 0  Down, Depressed, Hopeless 0 0 0  PHQ - 2 Score 0 0 0  Altered sleeping 0 0 0  Tired, decreased energy 0 0 0  Change in appetite 0 0 0  Feeling bad or failure about yourself  0 0 0  Trouble concentrating 0 0 0  Moving slowly or fidgety/restless 0 0 0  Suicidal thoughts 0 0 0  PHQ-9 Score 0 0 0  Difficult doing work/chores Not difficult at all Not difficult at all Not difficult at all    BP Readings from Last 3 Encounters:  03/09/23 128/70  11/07/22 136/78  06/12/22 136/78    Physical Exam Vitals and nursing note reviewed.  Constitutional:      General: He is not in acute distress.    Appearance: Normal appearance. He is well-developed.  HENT:     Head: Normocephalic and atraumatic.  Cardiovascular:     Rate and Rhythm: Normal rate and regular rhythm.     Heart sounds: No murmur heard. Pulmonary:     Effort: Pulmonary effort is normal. No respiratory distress.     Breath sounds: No wheezing or rhonchi.  Musculoskeletal:     Cervical back: Normal range of motion.     Right lower leg: No edema.     Left lower leg: No edema.  Lymphadenopathy:     Cervical: No cervical adenopathy.  Skin:    General: Skin is warm and dry.     Findings: No rash.  Neurological:     General: No focal deficit present.     Mental Status: He is alert and oriented to person, place, and time.  Psychiatric:        Mood and Affect: Mood normal.        Behavior: Behavior normal.     Wt Readings from Last 3 Encounters:  03/09/23 291 lb (132 kg)  11/07/22 (!) 366 lb (166 kg)  06/12/22 (!) 383 lb (173.7 kg)    BP 128/70   Pulse 72   Ht 6' (1.829 m)   Wt 291 lb (132 kg)   SpO2 98%   BMI 39.47 kg/m   Assessment and Plan:  Problem List Items Addressed This Visit       Cardiovascular and Mediastinum   Essential (primary)  hypertension - Primary (Chronic)    Clinically stable exam with well controlled BP on amlodipine,losartan, coreg and lasix. Tolerating medications without side effects. Pt to continue current regimen and low sodium diet. Consider tapering/discontinuing medications gradually if BP remains well controlled       Relevant Medications   apixaban (ELIQUIS) 5 MG TABS tablet   amLODipine (NORVASC) 10 MG tablet   carvedilol (COREG) 25 MG tablet   furosemide (LASIX) 20 MG tablet   losartan (COZAAR) 100 MG tablet   Paroxysmal atrial fibrillation ([facility]) (Chronic)    CHADSVaSC score = 1 He was recommended to continue Eliquis by Cardiology. Will refill medications today      Relevant Medications   apixaban (ELIQUIS) 5 MG TABS tablet   amLODipine (NORVASC) 10 MG tablet   carvedilol (COREG) 25 MG tablet   furosemide (LASIX) 20 MG tablet   losartan (COZAAR) 100 MG tablet   Other Visit Diagnoses     S/P gastric sleeve procedure       Doing well with tremendous weight loss continue vitamins/minerals supplement f/u with bariatric surgery       No follow-ups on file.   Partially dictated using [place], any errors are not intentional.  Glean Hess, MD [place], [place]

## 2023-03-09 NOTE — Assessment & Plan Note (Addendum)
Clinically stable exam with well controlled BP on amlodipine,losartan, coreg and lasix. Tolerating medications without side effects. Pt to continue current regimen and low sodium diet. Consider tapering/discontinuing medications gradually if BP remains well controlled

## 2023-03-23 ENCOUNTER — Encounter: Payer: Self-pay | Admitting: Internal Medicine

## 2023-03-23 ENCOUNTER — Encounter: Payer: PRIVATE HEALTH INSURANCE | Admitting: Internal Medicine

## 2023-03-23 NOTE — Progress Notes (Deleted)
Date:  03/23/2023   Name:  Bradley Love   DOB:  04-23-1966   MRN:  [id number]   Chief Complaint: No chief complaint on file.  HPI  Lab Results  Component Value Date   NA 143 05/29/2022   K 4.1 05/29/2022   CO2 28 (A) 05/29/2022   GLUCOSE 138 (H) 12/02/2021   BUN 23 (A) 05/29/2022   CREATININE 1.2 05/29/2022   CALCIUM 9.5 12/02/2021   EGFR 71 05/29/2022   GFRNONAA 59 (L) 11/29/2020   Lab Results  Component Value Date   CHOL 166 05/29/2022   HDL 43 05/29/2022   LDLCALC 101 05/29/2022   TRIG 110 05/29/2022   CHOLHDL 4.7 12/02/2021   Lab Results  Component Value Date   TSH 2.02 05/29/2022   Lab Results  Component Value Date   HGBA1C 5.1 05/29/2022   Lab Results  Component Value Date   WBC 10.2 05/29/2022   HGB 12.0 (A) 05/29/2022   HCT 34 (A) 05/29/2022   MCV 68 (L) 12/02/2021   PLT 105 (A) 05/29/2022   Lab Results  Component Value Date   ALT 24 05/29/2022   AST 12 (A) 05/29/2022   ALKPHOS 64 05/29/2022   BILITOT 1.4 (H) 12/02/2021   Lab Results  Component Value Date   VD25OH 17.5 05/29/2022     Review of Systems  Patient Active Problem List   Diagnosis Date Noted   Vitamin D deficiency 06/12/2022   Acquired thrombophilia 06/12/2022   Paroxysmal atrial fibrillation 12/02/2021   Mild hyperlipidemia 12/02/2021   CSF leak 03/17/2021   Chronic renal failure (CRF), stage 3a 11/29/2020   Rhinorrhea 10/28/2019   Obstructive sleep apnea syndrome 02/07/2019   Family history of sickle cell trait 10/31/2017   Morbid obesity with BMI of 45.0-49.9, adult 09/13/2016   Absolute anemia 09/13/2016   Essential (primary) hypertension 07/27/2015   Encounter for screening for malignant neoplasm of prostate 07/27/2015    No Known Allergies  Past Surgical History:  Procedure Laterality Date   CHOLECYSTECTOMY  12/18/2000   CORNEAL TRANSPLANT Right 12/18/2008   LAPAROSCOPIC GASTRIC SLEEVE RESECTION  01/03/2023   SINUS EXPLORATION Bilateral 03/2021   for  CSF leak    Social History   Tobacco Use   Smoking status: Former    Types: Cigarettes    Quit date: 1997    Years since quitting: 27.2   Smokeless tobacco: Never  Vaping Use   Vaping Use: Never used  Substance Use Topics   Alcohol use: Yes    Alcohol/week: 0.0 standard drinks of alcohol    Comment: occasionally -3-4x/yr   Drug use: No     Medication list has been reviewed and updated.  No outpatient medications have been marked as taking for the 03/23/23 encounter (Appointment) with Reubin Milan, MD.       03/09/2023    1:33 PM 11/07/2022    3:55 PM 06/12/2022    3:24 PM 12/13/2021    1:25 PM  GAD 7 : Generalized Anxiety Score  Nervous, Anxious, on Edge 0 0 0 0  Control/stop worrying 0 0 0 0  Worry too much - different things 0 0 1 0  Trouble relaxing 0 0 1 1  Restless 0 0 1 0  Easily annoyed or irritable 0 0 0 0  Afraid - awful might happen 0 0 0 0  Total GAD 7 Score 0 0 3 1  Anxiety Difficulty Not difficult at all Not difficult at all Not  difficult at all Not difficult at all       03/09/2023    1:32 PM 11/07/2022    3:55 PM 06/12/2022    3:24 PM  Depression screen PHQ 2/9  Decreased Interest 0 0 0  Down, Depressed, Hopeless 0 0 0  PHQ - 2 Score 0 0 0  Altered sleeping 0 0 0  Tired, decreased energy 0 0 0  Change in appetite 0 0 0  Feeling bad or failure about yourself  0 0 0  Trouble concentrating 0 0 0  Moving slowly or fidgety/restless 0 0 0  Suicidal thoughts 0 0 0  PHQ-9 Score 0 0 0  Difficult doing work/chores Not difficult at all Not difficult at all Not difficult at all    BP Readings from Last 3 Encounters:  03/09/23 128/70  11/07/22 136/78  06/12/22 136/78    Physical Exam  Wt Readings from Last 3 Encounters:  03/09/23 291 lb (132 kg)  11/07/22 (!) 366 lb (166 kg)  06/12/22 (!) 383 lb (173.7 kg)    There were no vitals taken for this visit.  Assessment and Plan:  Problem List Items Addressed This Visit   None   No  follow-ups on file.   Partially dictated using Dragon software, any errors are not intentional.  Reubin MilanLaura H. Chief Walkup, MD [facility] [place], [place]

## 2023-03-25 NOTE — Progress Notes (Signed)
error 

## 2023-06-27 ENCOUNTER — Other Ambulatory Visit: Payer: Self-pay | Admitting: Internal Medicine

## 2023-06-27 DIAGNOSIS — N522 Drug-induced erectile dysfunction: Secondary | ICD-10-CM

## 2023-06-27 NOTE — Telephone Encounter (Signed)
Medication Refill - Medication: sildenafil (VIAGRA) 50 MG tablet   Has the patient contacted their pharmacy? No. (Agent: If no, request that the patient contact the pharmacy for the refill. If patient does not wish to contact the pharmacy document the reason why and proceed with request.) (Agent: If yes, when and what did the pharmacy advise?)  Preferred Pharmacy (with phone number or street name):  [facility] [address] [place]),  - [address] Phone: [phone]  Fax: [phone]     Has the patient been seen for an appointment in the last year OR does the patient have an upcoming appointment? Yes.    Agent: Please be advised that RX refills may take up to 3 business days. We ask that you follow-up with your pharmacy.

## 2023-06-28 MED ORDER — SILDENAFIL CITRATE 50 MG PO TABS: 25.0000 mg | ORAL_TABLET | Freq: Every day | ORAL | 0 refills | Status: DC | PRN

## 2023-06-28 NOTE — Telephone Encounter (Signed)
Requested Prescriptions  Pending Prescriptions Disp Refills   sildenafil (VIAGRA) 50 MG tablet 90 tablet 0    Sig: Take 0.5 tablets (25 mg total) by mouth daily as needed for erectile dysfunction.     Urology: Erectile Dysfunction Agents Failed - 06/27/2023  3:36 PM      Failed - AST in normal range and within 360 days    AST  Date Value Ref Range Status  05/29/2022 12 (A) 14 - 40 Final         Failed - ALT in normal range and within 360 days    ALT  Date Value Ref Range Status  05/29/2022 24 10 - 40 U/L Final         Passed - Last BP in normal range    BP Readings from Last 1 Encounters:  03/09/23 128/70         Passed - Valid encounter within last 12 months    Recent Outpatient Visits           3 months ago Essential (primary) hypertension   [place] [facility] at [facility], Nyoka Cowden, MD   7 months ago Viral upper respiratory tract infection   [place] [facility] at [facility], Nyoka Cowden, MD   1 year ago Essential (primary) hypertension   [place] [facility] at [facility], Nyoka Cowden, MD   1 year ago Viral URI with cough   [place] [facility] at [facility], Nyoka Cowden, MD   1 year ago Annual physical exam   [facility] at [facility], Nyoka Cowden, MD

## 2023-07-04 ENCOUNTER — Ambulatory Visit: Payer: Self-pay | Admitting: *Deleted

## 2023-07-04 ENCOUNTER — Other Ambulatory Visit: Payer: Self-pay | Admitting: Internal Medicine

## 2023-07-04 DIAGNOSIS — I480 Paroxysmal atrial fibrillation: Secondary | ICD-10-CM

## 2023-07-04 NOTE — Telephone Encounter (Signed)
Called [facility] to verify if patient had refills of  Requested Prescriptions   Pending Prescriptions Disp Refills   apixaban (ELIQUIS) 5 MG TABS tablet 60 tablet 5    Sig: Take 1 tablet (5 mg total) by mouth 2 (two) times daily.    Pharmacy confirmed that there are available refills and will refill medication. Patient previously call to state that he had chest pain and that he needed refill of medication for a fib. Patient was triaged and advised patient to go to ED in a separate TE.

## 2023-07-04 NOTE — Telephone Encounter (Signed)
Medication Refill - Medication:  Pt requesting a refill on medication stated he does not know the name of the medication its pill you take it; if Afib is coming up. Stated not a new symptom; it just needs a refill.   Has the patient contacted their pharmacy? No. (Agent: If no, request that the patient contact the pharmacy for the refill. If patient does not wish to contact the pharmacy document the reason why and proceed with request.)   Preferred Pharmacy (with phone number or street name):  [facility] [address]), [place] - [address]  [address]) [place] [address]  Phone: [phone] Fax: [phone]  Hours: Not open 24 hours   Has the patient been seen for an appointment in the last year OR does the patient have an upcoming appointment? Yes.    Agent: Please be advised that RX refills may take up to 3 business days. We ask that you follow-up with your pharmacy.

## 2023-07-04 NOTE — Telephone Encounter (Signed)
Called pt he was on the way to [facility] ER.  KP

## 2023-07-04 NOTE — Telephone Encounter (Signed)
  Chief Complaint: Chest Pain Symptoms: Palpitations, fluttering. States "I know I'm in A-fib." Left sided chest pain, sweating Frequency: Minutes ago Pertinent Negatives: Patient denies  Disposition: [x] ED /[] Urgent Care (no appt availability in office) / [] Appointment(In office/virtual)/ []  [facility]/ [] Home Care/ [] Refused Recommended Disposition /[] [facility]/ []  Follow-up with PCP Additional Notes: Pt initially calling for refill of "A-fib medicine."  States out of it "For a while." Transferred to NT.  Advised ED, states workplace will call EMS.  Reason for Disposition  Visible sweat on face or sweat dripping down face  Answer Assessment - Initial Assessment Questions 1. LOCATION: "Where does it hurt?"       Left side 2. RADIATION: "Does the pain go anywhere else?" (e.g., into neck, jaw, arms, back)      3. ONSET: "When did the chest pain begin?" (Minutes, hours or days)      Minutes ago 4. PATTERN: "Does the pain come and go, or has it been constant since it started?"  "Does it get worse with exertion?"      Constant 5. DURATION: "How long does it last" (e.g., seconds, minutes, hours)     Constant 6. SEVERITY: "How bad is the pain?"  (e.g., Scale 1-10; mild, moderate, or severe)    - MILD (1-3): doesn't interfere with normal activities     - MODERATE (4-7): interferes with normal activities or awakens from sleep    - SEVERE (8-10): excruciating pain, unable to do any normal activities        7. CARDIAC RISK FACTORS: "Do you have any history of heart problems or risk factors for heart disease?" (e.g., angina, prior heart attack; diabetes, high blood pressure, high cholesterol, smoker, or strong family history of heart disease)      8. PULMONARY RISK FACTORS: "Do you have any history of lung disease?"  (e.g., blood clots in lung, asthma, emphysema, birth control pills)      9. CASE: "What do you think is causing the chest pain?"     A-fib 10. OTHER  SYMPTOMS: "Do you have any other symptoms?" (e.g., dizziness, nausea, vomiting, sweating, fever, difficulty breathing, cough)       Palpitations,fluttering, sweating  Protocols used: Chest Pain-A-AH

## 2023-07-05 ENCOUNTER — Telehealth: Payer: Self-pay

## 2023-07-05 ENCOUNTER — Telehealth: Payer: Self-pay | Admitting: Internal Medicine

## 2023-07-05 NOTE — Transitions of Care (Post Inpatient/ED Visit) (Unsigned)
   07/05/2023  Name: Bradley Love MRN: [id number] DOB: 11-24-1966  Today's TOC FU Call Status: Today's TOC FU Call Status:: Unsuccessul Call (1st Attempt) Unsuccessful Call (1st Attempt) Date: 07/05/23  Attempted to reach the patient regarding the most recent Inpatient/ED visit.  Follow Up Plan: Additional outreach attempts will be made to reach the patient to complete the Transitions of Care (Post Inpatient/ED visit) call.   Kimmie Berggren [facility]  Primary Care & Sports Medicine at [facility], AAMA [address]  [place] [address] Office [phone]  Fax: [phone]

## 2023-07-05 NOTE — Telephone Encounter (Signed)
Patient called in stated he needs his medication that he take for Afib sent in to the pharmacy but he was unable to think of he med. He stated he takes it as needed and the last time it was filled was in March of this year. He needs it sent to  [facility] [address]), [place] - [address] Phone: [phone]  Fax: [phone]    Please f/u with patient for more info

## 2023-07-05 NOTE — Telephone Encounter (Signed)
Spoke to pt went over his medication list. Told pt he had refill on all of the medications prescribed by Dr. Judithann Graves. Pt stated he picked all of the medication up yesterday. Pt stated he would call his provider in [place] to see if they could refill his medication that he needed.  KP

## 2023-07-09 NOTE — Transitions of Care (Post Inpatient/ED Visit) (Unsigned)
   07/09/2023  Name: Bradley Love MRN: [id number] DOB: 03-12-1966  Today's TOC FU Call Status: Today's TOC FU Call Status:: Unsuccessful Call (2nd Attempt) Unsuccessful Call (1st Attempt) Date: 07/05/23 Unsuccessful Call (2nd Attempt) Date: 07/09/23  Attempted to reach the patient regarding the most recent Inpatient/ED visit.  Follow Up Plan: Additional outreach attempts will be made to reach the patient to complete the Transitions of Care (Post Inpatient/ED visit) call.   Korina Tretter [facility]  Primary Care & Sports Medicine at [facility], AAMA [address]  [place] [address] Office [phone]  Fax: [phone]

## 2023-07-10 NOTE — Transitions of Care (Post Inpatient/ED Visit) (Signed)
07/10/2023  Name: Bradley Love MRN: [id number] DOB: [birth year]  Today's TOC FU Call Status: Today's TOC FU Call Status:: Successful TOC FU Call Competed Unsuccessful Call (1st Attempt) Date: 07/05/23 Unsuccessful Call (2nd Attempt) Date: 07/09/23 [facility] FU Call Complete Date: 07/10/23  Transition Care Management Follow-up Telephone Call Date of Discharge: 07/04/23 Discharge Facility: Other [facility]) Name of Other ([facility]) Discharge Facility: [facility] Type of Discharge: Emergency Department Reason for ED Visit: Other: Cardiac Conditions Diagnosis: Atrial Fibrillation How have you been since you were released from the hospital?: Better Any questions or concerns?: No  Items Reviewed: Did you receive and understand the discharge instructions provided?: No Medications obtained,verified, and reconciled?: Yes (Medications Reviewed) Any new allergies since your discharge?: No Dietary orders reviewed?: No Do you have support at home?: No  Medications Reviewed Today: Medications Reviewed Today     Reviewed by Mariel Sleet, CMA (Certified Medical Assistant) on 07/05/23 at 1001  Med List Status: <None>   Medication Order Taking? Sig Documenting Provider Last Dose Status Informant  albuterol (VENTOLIN HFA) 108 (90 Base) MCG/ACT inhaler [id number] No Inhale 2 puffs into the lungs 4 (four) times daily as needed. Duanne Limerick, MD Taking Active   amLODipine (NORVASC) 10 MG tablet [id number]  Take 1 tablet (10 mg total) by mouth daily. Reubin Milan, MD  Active   apixaban (ELIQUIS) 5 MG TABS tablet [id number]  Take 1 tablet (5 mg total) by mouth 2 (two) times daily. Reubin Milan, MD  Active   carvedilol (COREG) 25 MG tablet [id number]  Take 1 tablet (25 mg total) by mouth 2 (two) times daily. Reubin Milan, MD  Active   furosemide (LASIX) 20 MG tablet [id number]  Take 1 tablet (20 mg total) by mouth daily. Reubin Milan, MD  Active   losartan (COZAAR)  100 MG tablet [id number]  Take 1 tablet (100 mg total) by mouth daily. Reubin Milan, MD  Active   Multiple Vitamins-Minerals (MULTIVITAL PO) [id number] No Take by mouth daily. [provider] Taking Active Self  Omega 3 1200 MG CAPS [id number] No Take 1 capsule by mouth daily. [provider] Taking Active Self           Med Note Judithann Graves, Nyoka Cowden   Tue Dec 28, 2015  1:34 PM) Received from: [facility] Received Sig: Take by mouth.  sildenafil (VIAGRA) 50 MG tablet [id number]  Take 0.5 tablets (25 mg total) by mouth daily as needed for erectile dysfunction. Reubin Milan, MD  Active   UNABLE TO FIND [id number] No Med Name: CPAP @@ 4 cm H2O [provider] Taking Active Self            Home Care and Equipment/Supplies: Were Home Health Services Ordered?: No Any new equipment or medical supplies ordered?: No  Functional Questionnaire: Do you need assistance with bathing/showering or dressing?: No Do you need assistance with meal preparation?: No Do you need assistance with eating?: No Do you have difficulty maintaining continence: No Do you need assistance with getting out of bed/getting out of a chair/moving?: No Do you have difficulty managing or taking your medications?: No  Follow up appointments reviewed: PCP Follow-up appointment confirmed?: Yes Date of PCP follow-up appointment?: 07/16/23 Follow-up Provider: Bari Edward Do you need transportation to your follow-up appointment?: No Do you understand care options if your condition(s) worsen?: Yes-patient verbalized understanding    Marshe Shrestha [facility]  Primary Care & Sports Medicine at  [facility] [place], AAMA [address]  [place] [address] Office [phone]  Fax: [phone]

## 2023-07-16 ENCOUNTER — Inpatient Hospital Stay: Payer: PRIVATE HEALTH INSURANCE | Admitting: Internal Medicine

## 2023-07-17 ENCOUNTER — Ambulatory Visit (INDEPENDENT_AMBULATORY_CARE_PROVIDER_SITE_OTHER): Payer: PRIVATE HEALTH INSURANCE | Admitting: Internal Medicine

## 2023-07-17 ENCOUNTER — Encounter: Payer: Self-pay | Admitting: Internal Medicine

## 2023-07-17 VITALS — BP 128/74 | HR 70 | Ht 72.0 in | Wt 259.0 lb

## 2023-07-17 DIAGNOSIS — D6869 Other thrombophilia: Secondary | ICD-10-CM

## 2023-07-17 DIAGNOSIS — I480 Paroxysmal atrial fibrillation: Secondary | ICD-10-CM | POA: Diagnosis not present

## 2023-07-17 MED ORDER — DILTIAZEM HCL 30 MG PO TABS: 30.0000 mg | ORAL_TABLET | Freq: Every day | ORAL | 0 refills | Status: DC | PRN

## 2023-07-17 NOTE — Assessment & Plan Note (Signed)
Seen in ED with rapid Afib - had been out of Coreg for three days and may have been dehydrated He converted to SR and was discharged on same medications. EKG - SB @ 55 He will continue Coreg and Eliquis Rx for Cardiazem 30 mg to take PRN Afib He will follow up with Cardiology when he gets his transportation arranged

## 2023-07-17 NOTE — Progress Notes (Signed)
Date:  07/17/2023   Name:  Bradley Love   DOB:  11/24/1966   MRN:  [id number]   Chief Complaint: Atrial Fibrillation (ER follow up.)  Seen in ER for Afib.  Started at work - felt tightness in chest and heart was racing.  He felt lightheaded but no chest pain.  He was given IVF and a loading dose of oral Coreg.  He had been out of Coreg for three days prior. He feels well at present.  He continues to lose weight s/p gastric sleeve.   HPI  Lab Results  Component Value Date   NA 143 05/29/2022   K 4.1 05/29/2022   CO2 28 (A) 05/29/2022   GLUCOSE 138 (H) 12/02/2021   BUN 23 (A) 05/29/2022   CREATININE 1.2 05/29/2022   CALCIUM 9.5 12/02/2021   EGFR 71 05/29/2022   GFRNONAA 59 (L) 11/29/2020   Lab Results  Component Value Date   CHOL 166 05/29/2022   HDL 43 05/29/2022   LDLCALC 101 05/29/2022   TRIG 110 05/29/2022   CHOLHDL 4.7 12/02/2021   Lab Results  Component Value Date   TSH 2.02 05/29/2022   Lab Results  Component Value Date   HGBA1C 5.1 05/29/2022   Lab Results  Component Value Date   WBC 10.2 05/29/2022   HGB 12.0 (A) 05/29/2022   HCT 34 (A) 05/29/2022   MCV 68 (L) 12/02/2021   PLT 105 (A) 05/29/2022   Lab Results  Component Value Date   ALT 24 05/29/2022   AST 12 (A) 05/29/2022   ALKPHOS 64 05/29/2022   BILITOT 1.4 (H) 12/02/2021   Lab Results  Component Value Date   VD25OH 17.5 05/29/2022     Review of Systems  Patient Active Problem List   Diagnosis Date Noted   Vitamin D deficiency 06/12/2022   Acquired thrombophilia (HCC) 06/12/2022   Paroxysmal atrial fibrillation (HCC) 12/02/2021   Mild hyperlipidemia 12/02/2021   CSF leak 03/17/2021   Rhinorrhea 10/28/2019   Obstructive sleep apnea syndrome 02/07/2019   Family history of sickle cell trait 10/31/2017   Absolute anemia 09/13/2016   Essential (primary) hypertension 07/27/2015   Encounter for screening for malignant neoplasm of prostate 07/27/2015    No Known  Allergies  Past Surgical History:  Procedure Laterality Date   CHOLECYSTECTOMY  12/18/2000   CORNEAL TRANSPLANT Right 12/18/2008   LAPAROSCOPIC GASTRIC SLEEVE RESECTION  01/03/2023   SINUS EXPLORATION Bilateral 03/2021   for CSF leak    Social History   Tobacco Use   Smoking status: Former    Current packs/day: 0.00    Types: Cigarettes    Quit date: 1997    Years since quitting: 27.5   Smokeless tobacco: Never  Vaping Use   Vaping status: Never Used  Substance Use Topics   Alcohol use: Yes    Alcohol/week: 0.0 standard drinks of alcohol    Comment: occasionally -3-4x/yr   Drug use: No     Medication list has been reviewed and updated.  Current Meds  Medication Sig   albuterol (VENTOLIN HFA) 108 (90 Base) MCG/ACT inhaler Inhale 2 puffs into the lungs 4 (four) times daily as needed.   amLODipine (NORVASC) 10 MG tablet Take 1 tablet (10 mg total) by mouth daily.   apixaban (ELIQUIS) 5 MG TABS tablet Take 1 tablet (5 mg total) by mouth 2 (two) times daily.   carvedilol (COREG) 25 MG tablet Take 1 tablet (25 mg total) by mouth 2 (two) times  daily.   diltiazem (CARDIZEM) 30 MG tablet Take 1 tablet (30 mg total) by mouth daily as needed (Atrial fibrillation).   furosemide (LASIX) 20 MG tablet Take 1 tablet (20 mg total) by mouth daily.   losartan (COZAAR) 100 MG tablet Take 1 tablet (100 mg total) by mouth daily.   Multiple Vitamins-Minerals (MULTIVITAL PO) Take by mouth daily.   Omega 3 1200 MG CAPS Take 1 capsule by mouth daily.   sildenafil (VIAGRA) 50 MG tablet Take 0.5 tablets (25 mg total) by mouth daily as needed for erectile dysfunction.   UNABLE TO FIND Med Name: CPAP @@ 4 cm H2O       07/17/2023    9:51 AM 03/09/2023    1:33 PM 11/07/2022    3:55 PM 06/12/2022    3:24 PM  GAD 7 : Generalized Anxiety Score  Nervous, Anxious, on Edge 0 0 0 0  Control/stop worrying 0 0 0 0  Worry too much - different things 0 0 0 1  Trouble relaxing 0 0 0 1  Restless 0 0 0 1   Easily annoyed or irritable 0 0 0 0  Afraid - awful might happen 0 0 0 0  Total GAD 7 Score 0 0 0 3  Anxiety Difficulty Not difficult at all Not difficult at all Not difficult at all Not difficult at all       07/17/2023    9:51 AM 03/09/2023    1:32 PM 11/07/2022    3:55 PM  Depression screen PHQ 2/9  Decreased Interest 0 0 0  Down, Depressed, Hopeless 0 0 0  PHQ - 2 Score 0 0 0  Altered sleeping 0 0 0  Tired, decreased energy 0 0 0  Change in appetite 1 0 0  Feeling bad or failure about yourself  0 0 0  Trouble concentrating 0 0 0  Moving slowly or fidgety/restless 0 0 0  Suicidal thoughts 0 0 0  PHQ-9 Score 1 0 0  Difficult doing work/chores Not difficult at all Not difficult at all Not difficult at all    BP Readings from Last 3 Encounters:  07/17/23 128/74  03/09/23 128/70  11/07/22 136/78    Physical Exam Constitutional:      Appearance: Normal appearance.  Cardiovascular:     Rate and Rhythm: Normal rate. Rhythm irregular. Frequent Extrasystoles are present.    Heart sounds: Normal heart sounds.  Pulmonary:     Effort: Pulmonary effort is normal.     Breath sounds: Normal breath sounds.  Musculoskeletal:     Cervical back: Normal range of motion.  Neurological:     General: No focal deficit present.     Mental Status: He is alert.  Psychiatric:        Mood and Affect: Mood normal.        Behavior: Behavior normal.     Wt Readings from Last 3 Encounters:  07/17/23 259 lb (117.5 kg)  03/09/23 291 lb (132 kg)  11/07/22 (!) 366 lb (166 kg)    BP 128/74   Pulse 70   Ht 6' (1.829 m)   Wt 259 lb (117.5 kg)   SpO2 98%   BMI 35.13 kg/m   Assessment and Plan:  Problem List Items Addressed This Visit       Unprioritized   Paroxysmal atrial fibrillation (HCC) - Primary (Chronic)    Seen in ED with rapid Afib - had been out of Coreg for three days and may have been  dehydrated He converted to SR and was discharged on same medications. EKG - SB @  55 He will continue Coreg and Eliquis Rx for Cardiazem 30 mg to take PRN Afib He will follow up with Cardiology when he gets his transportation arranged      Relevant Medications   diltiazem (CARDIZEM) 30 MG tablet   Other Relevant Orders   EKG 12-Lead (Completed)   Acquired thrombophilia (HCC) (Chronic)    No follow-ups on file.    Reubin Milan, MD [facility] Primary Care and Sports Medicine [place]

## 2023-10-12 ENCOUNTER — Other Ambulatory Visit: Payer: Self-pay | Admitting: Internal Medicine

## 2023-10-12 DIAGNOSIS — I10 Essential (primary) hypertension: Secondary | ICD-10-CM

## 2023-10-12 NOTE — Telephone Encounter (Signed)
Medication Refill - Medication: amLODipine (NORVASC) 10 MG tablet, carvedilol (COREG) 25 MG tablet, furosemide (LASIX) 20 MG tablet, and losartan (COZAAR) 100 MG tablet   Has the patient contacted their pharmacy? Yes.   Pt told to contact provider  Preferred Pharmacy (with phone number or street name):  [facility] [address] [place]), [place] - [address] Phone: [phone]  Fax: [phone]     Has the patient been seen for an appointment in the last year OR does the patient have an upcoming appointment? Yes.    Agent: Please be advised that RX refills may take up to 3 business days. We ask that you follow-up with your pharmacy.

## 2023-10-15 MED ORDER — FUROSEMIDE 20 MG PO TABS
20.0000 mg | ORAL_TABLET | Freq: Every day | ORAL | 0 refills | Status: DC
Start: 2023-10-15 — End: 2024-03-24

## 2023-10-15 MED ORDER — AMLODIPINE BESYLATE 10 MG PO TABS
10.0000 mg | ORAL_TABLET | Freq: Every day | ORAL | 2 refills | Status: DC
Start: 2023-10-15 — End: 2024-02-04

## 2023-10-15 MED ORDER — CARVEDILOL 25 MG PO TABS
25.0000 mg | ORAL_TABLET | Freq: Two times a day (BID) | ORAL | 0 refills | Status: DC
Start: 2023-10-15 — End: 2024-03-24

## 2023-10-15 MED ORDER — LOSARTAN POTASSIUM 100 MG PO TABS: 100.0000 mg | ORAL_TABLET | Freq: Every day | ORAL | 0 refills | Status: DC

## 2023-10-15 NOTE — Telephone Encounter (Signed)
Requested medication (s) are due for refill today: yes  Requested medication (s) are on the active medication list: yes  Last refill:  each med last refilled 03/09/23 each for 6 months fill except for Furosemide  Future visit scheduled: no  Notes to clinic:  overdue lab work   Requested Prescriptions  Pending Prescriptions Disp Refills   amLODipine (NORVASC) 10 MG tablet 30 tablet 5    Sig: Take 1 tablet (10 mg total) by mouth daily.     Cardiovascular: Calcium Channel Blockers 2 Passed - 10/12/2023  1:04 PM      Passed - Last BP in normal range    BP Readings from Last 1 Encounters:  07/17/23 128/74         Passed - Last Heart Rate in normal range    Pulse Readings from Last 1 Encounters:  07/17/23 70         Passed - Valid encounter within last 6 months    Recent Outpatient Visits           3 months ago Paroxysmal atrial fibrillation [facility])   [place] [facility] at [facility], Nyoka Cowden, MD   7 months ago Essential (primary) hypertension   [place] [facility] at [facility], Nyoka Cowden, MD   11 months ago Viral upper respiratory tract infection   [place] [facility] at [facility], Nyoka Cowden, MD   1 year ago Essential (primary) hypertension   [place] [facility] at [facility], Nyoka Cowden, MD   1 year ago Viral URI with cough   [place] [facility] at [facility], Nyoka Cowden, MD               carvedilol (COREG) 25 MG tablet 180 tablet 1    Sig: Take 1 tablet (25 mg total) by mouth 2 (two) times daily.     Cardiovascular: Beta Blockers 3 Failed - 10/12/2023  1:04 PM      Failed - Cr in normal range and within 360 days    Creatinine  Date Value Ref Range Status  05/29/2022 1.2 0.6 - 1.3 Final  04/18/2014 1.41 (H) 0.60 - 1.30 mg/dL Final   Creatinine, Ser  Date Value Ref Range Status   12/02/2021 1.37 (H) 0.76 - 1.27 mg/dL Final         Failed - AST in normal range and within 360 days    AST  Date Value Ref Range Status  05/29/2022 12 (A) 14 - 40 Final         Failed - ALT in normal range and within 360 days    ALT  Date Value Ref Range Status  05/29/2022 24 10 - 40 U/L Final         Passed - Last BP in normal range    BP Readings from Last 1 Encounters:  07/17/23 128/74         Passed - Last Heart Rate in normal range    Pulse Readings from Last 1 Encounters:  07/17/23 70         Passed - Valid encounter within last 6 months    Recent Outpatient Visits           3 months ago Paroxysmal atrial fibrillation [facility])   [place] [facility] at [facility], Nyoka Cowden, MD  7 months ago Essential (primary) hypertension   [place] [facility] at [facility], Nyoka Cowden, MD   11 months ago Viral upper respiratory tract infection   [place] [facility] at [facility], Nyoka Cowden, MD   1 year ago Essential (primary) hypertension   [place] [facility] at [facility], Nyoka Cowden, MD   1 year ago Viral URI with cough   [place] [facility] at [facility], Nyoka Cowden, MD               furosemide (LASIX) 20 MG tablet 90 tablet 0    Sig: Take 1 tablet (20 mg total) by mouth daily.     Cardiovascular:  Diuretics - Loop Failed - 10/12/2023  1:04 PM      Failed - K in normal range and within 180 days    Potassium  Date Value Ref Range Status  05/29/2022 4.1 3.5 - 5.1 mEq/L Final  04/18/2014 3.6 3.5 - 5.1 mmol/L Final         Failed - Ca in normal range and within 180 days    Calcium  Date Value Ref Range Status  12/02/2021 9.5 8.7 - 10.2 mg/dL Final   Calcium, Total  Date Value Ref Range Status  04/18/2014 8.7 8.5 - 10.1 mg/dL Final         Failed - Na in normal range and within 180  days    Sodium  Date Value Ref Range Status  05/29/2022 143 137 - 147 Final  04/18/2014 142 136 - 145 mmol/L Final         Failed - Cr in normal range and within 180 days    Creatinine  Date Value Ref Range Status  05/29/2022 1.2 0.6 - 1.3 Final  04/18/2014 1.41 (H) 0.60 - 1.30 mg/dL Final   Creatinine, Ser  Date Value Ref Range Status  12/02/2021 1.37 (H) 0.76 - 1.27 mg/dL Final         Failed - Cl in normal range and within 180 days    Chloride  Date Value Ref Range Status  05/29/2022 106 99 - 108 Final  04/18/2014 106 98 - 107 mmol/L Final         Failed - Mg Level in normal range and within 180 days    No results found for: "MG"       Passed - Last BP in normal range    BP Readings from Last 1 Encounters:  07/17/23 128/74         Passed - Valid encounter within last 6 months    Recent Outpatient Visits           3 months ago Paroxysmal atrial fibrillation [facility])   [place] [facility] at [facility], Nyoka Cowden, MD   7 months ago Essential (primary) hypertension   [place] [facility] at [facility], Nyoka Cowden, MD   11 months ago Viral upper respiratory tract infection   [place] [facility] Medicine at [facility], Nyoka Cowden, MD   1 year ago Essential (primary) hypertension   [place] [facility] at [facility], Nyoka Cowden, MD   1 year ago Viral URI with cough   [facility] at [facility], Nyoka Cowden, MD  losartan (COZAAR) 100 MG tablet 90 tablet 1    Sig: Take 1 tablet (100 mg total) by mouth daily.     Cardiovascular:  Angiotensin Receptor Blockers Failed - 10/12/2023  1:04 PM      Failed - Cr in normal range and within 180 days    Creatinine  Date Value Ref Range Status  05/29/2022 1.2 0.6 - 1.3 Final  04/18/2014 1.41 (H) 0.60 - 1.30 mg/dL Final   Creatinine, Ser   Date Value Ref Range Status  12/02/2021 1.37 (H) 0.76 - 1.27 mg/dL Final         Failed - K in normal range and within 180 days    Potassium  Date Value Ref Range Status  05/29/2022 4.1 3.5 - 5.1 mEq/L Final  04/18/2014 3.6 3.5 - 5.1 mmol/L Final         Passed - Patient is not pregnant      Passed - Last BP in normal range    BP Readings from Last 1 Encounters:  07/17/23 128/74         Passed - Valid encounter within last 6 months    Recent Outpatient Visits           3 months ago Paroxysmal atrial fibrillation [facility])   [place] [facility] at [facility], Nyoka Cowden, MD   7 months ago Essential (primary) hypertension   [place] [facility] at [facility], Nyoka Cowden, MD   11 months ago Viral upper respiratory tract infection   [place] [facility] at [facility], Nyoka Cowden, MD   1 year ago Essential (primary) hypertension   [place] [facility] at [facility], Nyoka Cowden, MD   1 year ago Viral URI with cough   [facility] at [facility], Nyoka Cowden, MD

## 2023-11-30 DIAGNOSIS — M170 Bilateral primary osteoarthritis of knee: Secondary | ICD-10-CM | POA: Insufficient documentation

## 2023-12-06 ENCOUNTER — Other Ambulatory Visit: Payer: Self-pay | Admitting: Internal Medicine

## 2023-12-06 DIAGNOSIS — I480 Paroxysmal atrial fibrillation: Secondary | ICD-10-CM

## 2023-12-06 NOTE — Telephone Encounter (Signed)
Medication Refill -  Most Recent Primary Care Visit:  Provider: Reubin Milan  Department: [facility]  Visit Type: OFFICE VISIT  Date: 07/17/2023  Medication: diltiazem (CARDIZEM) 30 MG tablet   Has the patient contacted their pharmacy? No Pt was not sure of the medication name, so called his dr first. Is this the correct pharmacy for this prescription? No If no, delete pharmacy and type the correct one.  This is the patient's preferred pharmacy:  [facility] [address]), [place] - [address] [place]) [place] [address] Phone: [phone] Fax: [phone]  Has the prescription been filled recently? No it is prn  Is the patient out of the medication? Yes  Has the patient been seen for an appointment in the last year OR does the patient have an upcoming appointment? Yes  Can we respond through MyChart? Yes  Agent: Please be advised that Rx refills may take up to 3 business days. We ask that you follow-up with your pharmacy.

## 2023-12-06 NOTE — Telephone Encounter (Signed)
Please review.  KP

## 2023-12-06 NOTE — Telephone Encounter (Signed)
Requested medication (s) are due for refill today: yes  Requested medication (s) are on the active medication list: yes  Last refill:  07/17/23 #30  Future visit scheduled: no  Notes to clinic:   overdue lab work    Requested Prescriptions  Pending Prescriptions Disp Refills   diltiazem (CARDIZEM) 30 MG tablet 30 tablet 0    Sig: Take 1 tablet (30 mg total) by mouth daily as needed (Atrial fibrillation).     Cardiovascular: Calcium Channel Blockers 3 Failed - 12/06/2023  4:26 PM      Failed - ALT in normal range and within 360 days    ALT  Date Value Ref Range Status  05/29/2022 24 10 - 40 U/L Final         Failed - AST in normal range and within 360 days    AST  Date Value Ref Range Status  05/29/2022 12 (A) 14 - 40 Final         Failed - Cr in normal range and within 360 days    Creatinine  Date Value Ref Range Status  05/29/2022 1.2 0.6 - 1.3 Final  04/18/2014 1.41 (H) 0.60 - 1.30 mg/dL Final   Creatinine, Ser  Date Value Ref Range Status  12/02/2021 1.37 (H) 0.76 - 1.27 mg/dL Final         Passed - Last BP in normal range    BP Readings from Last 1 Encounters:  07/17/23 128/74         Passed - Last Heart Rate in normal range    Pulse Readings from Last 1 Encounters:  07/17/23 70         Passed - Valid encounter within last 6 months    Recent Outpatient Visits           4 months ago Paroxysmal atrial fibrillation [facility])   [place] [facility] at [facility], Nyoka Cowden, MD   9 months ago Essential (primary) hypertension   [place] [facility] at [facility] Nyoka Cowden, MD   1 year ago Viral upper respiratory tract infection   [place] [facility] at [facility], Nyoka Cowden, MD   1 year ago Essential (primary) hypertension   [place] [facility] at [facility], Nyoka Cowden, MD   1 year ago Viral URI with cough   [facility]  Primary Care & Sports Medicine at [facility], Nyoka Cowden, MD

## 2023-12-07 MED ORDER — DILTIAZEM HCL 30 MG PO TABS: 30.0000 mg | ORAL_TABLET | Freq: Every day | ORAL | 0 refills | Status: DC | PRN

## 2024-01-14 ENCOUNTER — Ambulatory Visit: Payer: Self-pay

## 2024-01-14 NOTE — Telephone Encounter (Signed)
PEC agent attempting to connect to triage nurse. Pt. Disconnected. Left message to call back.

## 2024-02-03 ENCOUNTER — Other Ambulatory Visit: Payer: Self-pay | Admitting: Internal Medicine

## 2024-02-03 DIAGNOSIS — I10 Essential (primary) hypertension: Secondary | ICD-10-CM

## 2024-02-04 ENCOUNTER — Encounter: Payer: Self-pay | Admitting: Internal Medicine

## 2024-02-04 ENCOUNTER — Ambulatory Visit: Payer: 59 | Admitting: Internal Medicine

## 2024-02-04 VITALS — BP 124/70 | HR 79 | Temp 98.3°F | Ht 72.0 in | Wt 254.0 lb

## 2024-02-04 DIAGNOSIS — I480 Paroxysmal atrial fibrillation: Secondary | ICD-10-CM | POA: Diagnosis not present

## 2024-02-04 DIAGNOSIS — Z23 Encounter for immunization: Secondary | ICD-10-CM

## 2024-02-04 DIAGNOSIS — J069 Acute upper respiratory infection, unspecified: Secondary | ICD-10-CM | POA: Diagnosis not present

## 2024-02-04 LAB — POC COVID19 BINAXNOW: SARS Coronavirus 2 Ag: NEGATIVE

## 2024-02-04 LAB — POCT INFLUENZA A/B
Influenza A, POC: NEGATIVE
Influenza B, POC: NEGATIVE

## 2024-02-04 MED ORDER — PROMETHAZINE-DM 6.25-15 MG/5ML PO SYRP: 5.0000 mL | ORAL_SOLUTION | Freq: Four times a day (QID) | ORAL | 0 refills | Status: AC | PRN

## 2024-02-04 MED ORDER — ALBUTEROL SULFATE HFA 108 (90 BASE) MCG/ACT IN AERS: 2.0000 | INHALATION_SPRAY | Freq: Four times a day (QID) | RESPIRATORY_TRACT | 2 refills | Status: DC | PRN

## 2024-02-04 NOTE — Telephone Encounter (Signed)
 Requested medication (s) are due for refill today: yes  Requested medication (s) are on the active medication list: yes  Last refill:  amlodipine 10/15/23 #30/2, losartan 10/15/23 #90/0  Future visit scheduled: no  Notes to clinic:  Unable to refill per protocol due to failed labs, no updated results.      Requested Prescriptions  Pending Prescriptions Disp Refills   losartan (COZAAR) 100 MG tablet [Pharmacy Med Name: Losartan Potassium 100 MG Oral Tablet] 90 tablet 0    Sig: Take 1 tablet by mouth once daily     Cardiovascular:  Angiotensin Receptor Blockers Failed - 02/04/2024  3:40 PM      Failed - Cr in normal range and within 180 days    Creatinine  Date Value Ref Range Status  05/29/2022 1.2 0.6 - 1.3 Final  04/18/2014 1.41 (H) 0.60 - 1.30 mg/dL Final   Creatinine, Ser  Date Value Ref Range Status  12/02/2021 1.37 (H) 0.76 - 1.27 mg/dL Final         Failed - K in normal range and within 180 days    Potassium  Date Value Ref Range Status  05/29/2022 4.1 3.5 - 5.1 mEq/L Final  04/18/2014 3.6 3.5 - 5.1 mmol/L Final         Failed - Valid encounter within last 6 months    Recent Outpatient Visits           6 months ago Paroxysmal atrial fibrillation [facility])   [place] [facility] at [facility], Nyoka Cowden, MD   11 months ago Essential (primary) hypertension   [place] [facility] at [facility], Nyoka Cowden, MD   1 year ago Viral upper respiratory tract infection   [place] [facility] at [facility], Nyoka Cowden, MD   1 year ago Essential (primary) hypertension   [place] [facility] at [facility], Nyoka Cowden, MD   2 years ago Viral URI with cough   [place] [facility] at [facility], Nyoka Cowden, MD              Passed - Patient is not pregnant      Passed - Last BP in normal range    BP  Readings from Last 1 Encounters:  02/04/24 124/70          amLODipine (NORVASC) 10 MG tablet [Pharmacy Med Name: amLODIPine Besylate 10 MG Oral Tablet] 30 tablet 0    Sig: Take 1 tablet by mouth once daily     Cardiovascular: Calcium Channel Blockers 2 Failed - 02/04/2024  3:40 PM      Failed - Valid encounter within last 6 months    Recent Outpatient Visits           6 months ago Paroxysmal atrial fibrillation [facility])   [place] [facility] at [facility], Nyoka Cowden, MD   11 months ago Essential (primary) hypertension   [place] [facility] at [facility], Nyoka Cowden, MD   1 year ago Viral upper respiratory tract infection   [place] [facility] at [facility], Nyoka Cowden, MD   1 year ago Essential (primary) hypertension   [place] [facility] at [facility], Nyoka Cowden, MD   2 years ago Viral URI with cough  [facility] Care [facility] Medicine at [facility], Nyoka Cowden, MD              Passed - Last BP in normal range    BP Readings from Last 1 Encounters:  02/04/24 124/70         Passed - Last Heart Rate in normal range    Pulse Readings from Last 1 Encounters:  02/04/24 79

## 2024-02-04 NOTE — Progress Notes (Signed)
 Date:  02/04/2024   Name:  Bradley Love   DOB:  1966/12/13   MRN:  [id number]   Chief Complaint: Cough (Sore throat, congestion, and cough X 3 days. NO fever or SOB.)  URI  This is a new problem. Episode onset: three days ago. There has been no fever. Associated symptoms include congestion, coughing, a sore throat and wheezing. Pertinent negatives include no abdominal pain, ear pain, headaches, nausea, plugged ear sensation or vomiting. He has tried decongestant for the symptoms. The treatment provided mild relief.    Review of Systems  Constitutional:  Negative for appetite change, chills, diaphoresis and fever.  HENT:  Positive for congestion and sore throat. Negative for ear pain and sinus pressure.   Respiratory:  Positive for cough and wheezing.   Gastrointestinal:  Negative for abdominal pain, nausea and vomiting.  Neurological:  Negative for dizziness, light-headedness and headaches.  Psychiatric/Behavioral:  Negative for sleep disturbance.      Lab Results  Component Value Date   NA 143 05/29/2022   K 4.1 05/29/2022   CO2 28 (A) 05/29/2022   GLUCOSE 138 (H) 12/02/2021   BUN 23 (A) 05/29/2022   CREATININE 1.2 05/29/2022   CALCIUM 9.5 12/02/2021   EGFR 71 05/29/2022   GFRNONAA 59 (L) 11/29/2020   Lab Results  Component Value Date   CHOL 166 05/29/2022   HDL 43 05/29/2022   LDLCALC 101 05/29/2022   TRIG 110 05/29/2022   CHOLHDL 4.7 12/02/2021   Lab Results  Component Value Date   TSH 2.02 05/29/2022   Lab Results  Component Value Date   HGBA1C 5.1 05/29/2022   Lab Results  Component Value Date   WBC 10.2 05/29/2022   HGB 12.0 (A) 05/29/2022   HCT 34 (A) 05/29/2022   MCV 68 (L) 12/02/2021   PLT 105 (A) 05/29/2022   Lab Results  Component Value Date   ALT 24 05/29/2022   AST 12 (A) 05/29/2022   ALKPHOS 64 05/29/2022   BILITOT 1.4 (H) 12/02/2021   Lab Results  Component Value Date   VD25OH 17.5 05/29/2022     Patient Active Problem List    Diagnosis Date Noted   Vitamin D deficiency 06/12/2022   Acquired thrombophilia (HCC) 06/12/2022   Paroxysmal atrial fibrillation (HCC) 12/02/2021   Mild hyperlipidemia 12/02/2021   CSF leak 03/17/2021   Obstructive sleep apnea syndrome 02/07/2019   Family history of sickle cell trait 10/31/2017   Absolute anemia 09/13/2016   Essential (primary) hypertension 07/27/2015    No Known Allergies  Past Surgical History:  Procedure Laterality Date   CHOLECYSTECTOMY  12/18/2000   CORNEAL TRANSPLANT Right 12/18/2008   LAPAROSCOPIC GASTRIC SLEEVE RESECTION  01/03/2023   SINUS EXPLORATION Bilateral 03/2021   for CSF leak    Social History   Tobacco Use   Smoking status: Former    Current packs/day: 0.00    Types: Cigarettes    Quit date: 1997    Years since quitting: 28.1   Smokeless tobacco: Never  Vaping Use   Vaping status: Never Used  Substance Use Topics   Alcohol use: Yes    Alcohol/week: 0.0 standard drinks of alcohol    Comment: occasionally -3-4x/yr   Drug use: No     Medication list has been reviewed and updated.  Current Meds  Medication Sig   amLODipine (NORVASC) 10 MG tablet Take 1 tablet (10 mg total) by mouth daily.   apixaban (ELIQUIS) 5 MG TABS tablet Take 1  tablet (5 mg total) by mouth 2 (two) times daily.   carvedilol (COREG) 25 MG tablet Take 1 tablet (25 mg total) by mouth 2 (two) times daily.   diltiazem (CARDIZEM) 30 MG tablet Take 1 tablet (30 mg total) by mouth daily as needed (Atrial fibrillation).   furosemide (LASIX) 20 MG tablet Take 1 tablet (20 mg total) by mouth daily.   losartan (COZAAR) 100 MG tablet Take 1 tablet (100 mg total) by mouth daily.   Multiple Vitamins-Minerals (MULTIVITAL PO) Take by mouth daily.   Omega 3 1200 MG CAPS Take 1 capsule by mouth daily.   promethazine-dextromethorphan (PROMETHAZINE-DM) 6.25-15 MG/5ML syrup Take 5 mLs by mouth 4 (four) times daily as needed for up to 9 days for cough.   sildenafil (VIAGRA) 50 MG  tablet Take 0.5 tablets (25 mg total) by mouth daily as needed for erectile dysfunction.   UNABLE TO FIND Med Name: CPAP @@ 4 cm H2O   [DISCONTINUED] albuterol (VENTOLIN HFA) 108 (90 Base) MCG/ACT inhaler Inhale 2 puffs into the lungs 4 (four) times daily as needed.       07/17/2023    9:51 AM 03/09/2023    1:33 PM 11/07/2022    3:55 PM 06/12/2022    3:24 PM  GAD 7 : Generalized Anxiety Score  Nervous, Anxious, on Edge 0 0 0 0  Control/stop worrying 0 0 0 0  Worry too much - different things 0 0 0 1  Trouble relaxing 0 0 0 1  Restless 0 0 0 1  Easily annoyed or irritable 0 0 0 0  Afraid - awful might happen 0 0 0 0  Total GAD 7 Score 0 0 0 3  Anxiety Difficulty Not difficult at all Not difficult at all Not difficult at all Not difficult at all       07/17/2023    9:51 AM 03/09/2023    1:32 PM 11/07/2022    3:55 PM  Depression screen PHQ 2/9  Decreased Interest 0 0 0  Down, Depressed, Hopeless 0 0 0  PHQ - 2 Score 0 0 0  Altered sleeping 0 0 0  Tired, decreased energy 0 0 0  Change in appetite 1 0 0  Feeling bad or failure about yourself  0 0 0  Trouble concentrating 0 0 0  Moving slowly or fidgety/restless 0 0 0  Suicidal thoughts 0 0 0  PHQ-9 Score 1 0 0  Difficult doing work/chores Not difficult at all Not difficult at all Not difficult at all    BP Readings from Last 3 Encounters:  02/04/24 124/70  07/17/23 128/74  03/09/23 128/70    Physical Exam Constitutional:      Appearance: He is obese.  HENT:     Nose:     Right Sinus: No maxillary sinus tenderness or frontal sinus tenderness.     Left Sinus: No maxillary sinus tenderness or frontal sinus tenderness.     Mouth/Throat:     Pharynx: Oropharynx is clear.  Cardiovascular:     Rate and Rhythm: Normal rate and regular rhythm.  Pulmonary:     Effort: Pulmonary effort is normal.     Breath sounds: Wheezing present. No rhonchi.  Musculoskeletal:     Cervical back: Normal range of motion.  Neurological:      Mental Status: He is alert.     Wt Readings from Last 3 Encounters:  07/17/23 259 lb (117.5 kg)  03/09/23 291 lb (132 kg)  11/07/22 (!) 366 lb (  166 kg)    BP 124/70   Pulse 79   Temp 98.3 F (36.8 C) (Oral)   Ht 6' (1.829 m)   SpO2 100%   BMI 35.13 kg/m   Assessment and Plan:  Problem List Items Addressed This Visit       Unprioritized   Paroxysmal atrial fibrillation (HCC) (Chronic)   Other Visit Diagnoses       Viral upper respiratory tract infection    -  Primary   nonspecific URI treat with albuterol MDI for wheezing; cough suppression fluids and rest   Relevant Medications   albuterol (VENTOLIN HFA) 108 (90 Base) MCG/ACT inhaler   promethazine-dextromethorphan (PROMETHAZINE-DM) 6.25-15 MG/5ML syrup   Other Relevant Orders   POC COVID-19 BinaxNow (Completed)   POCT Influenza A/B (Completed)     Immunization due       Relevant Orders   Flu vaccine trivalent PF, 6mos and older(Flulaval,Afluria,Fluarix,Fluzone) (Completed)       No follow-ups on file.    Reubin Milan, MD [facility] [place]

## 2024-03-19 ENCOUNTER — Other Ambulatory Visit: Payer: Self-pay | Admitting: Internal Medicine

## 2024-03-19 DIAGNOSIS — I10 Essential (primary) hypertension: Secondary | ICD-10-CM

## 2024-03-19 NOTE — Telephone Encounter (Unsigned)
 Copied from CRM [id number]. Topic: Clinical - Medication Refill >> Mar 19, 2024  9:15 AM Everette C wrote: Most Recent Primary Care Visit:  Provider: Reubin Milan  Department: [facility]  Visit Type: OFFICE VISIT  Date: 02/04/2024  Medication: carvedilol (COREG) 25 MG tablet [[id number]]  furosemide (LASIX) 20 MG tablet [[id number]]  losartan (COZAAR) 100 MG tablet [[id number]]  amLODipine (NORVASC) 10 MG tablet [[id number]] Has the patient contacted their pharmacy? Yes (Agent: If no, request that the patient contact the pharmacy for the refill. If patient does not wish to contact the pharmacy document the reason why and proceed with request.) (Agent: If yes, when and what did the pharmacy advise?)  Is this the correct pharmacy for this prescription? Yes If no, delete pharmacy and type the correct one.  This is the patient's preferred pharmacy:  [facility] [address]), [place] - [address] [place]) [place] [address] Phone: [phone] Fax: [phone]  Has the prescription been filled recently? Yes  Is the patient out of the medication? No  Has the patient been seen for an appointment in the last year OR does the patient have an upcoming appointment? Yes  Can we respond through MyChart? No  Agent: Please be advised that Rx refills may take up to 3 business days. We ask that you follow-up with your pharmacy.

## 2024-03-20 NOTE — Telephone Encounter (Signed)
 Requested medication (s) are due for refill today: yes  Requested medication (s) are on the active medication list: yes  Last refill:  02/04/24  Future visit scheduled: no  Notes to clinic:  Unable to refill per protocol, courtesy refill already given, routing for provider approval.      Requested Prescriptions  Pending Prescriptions Disp Refills   amLODipine (NORVASC) 10 MG tablet 30 tablet 0    Sig: Take 1 tablet (10 mg total) by mouth daily.     Cardiovascular: Calcium Channel Blockers 2 Passed - 03/20/2024  3:29 PM      Passed - Last BP in normal range    BP Readings from Last 1 Encounters:  02/04/24 124/70         Passed - Last Heart Rate in normal range    Pulse Readings from Last 1 Encounters:  02/04/24 79         Passed - Valid encounter within last 6 months    Recent Outpatient Visits           1 month ago Viral upper respiratory tract infection   [place] [facility] at [facility], Nyoka Cowden, MD               carvedilol (COREG) 25 MG tablet 180 tablet 0    Sig: Take 1 tablet (25 mg total) by mouth 2 (two) times daily.     Cardiovascular: Beta Blockers 3 Failed - 03/20/2024  3:29 PM      Failed - Cr in normal range and within 360 days    Creatinine  Date Value Ref Range Status  05/29/2022 1.2 0.6 - 1.3 Final  04/18/2014 1.41 (H) 0.60 - 1.30 mg/dL Final   Creatinine, Ser  Date Value Ref Range Status  12/02/2021 1.37 (H) 0.76 - 1.27 mg/dL Final         Failed - AST in normal range and within 360 days    AST  Date Value Ref Range Status  05/29/2022 12 (A) 14 - 40 Final         Failed - ALT in normal range and within 360 days    ALT  Date Value Ref Range Status  05/29/2022 24 10 - 40 U/L Final         Passed - Last BP in normal range    BP Readings from Last 1 Encounters:  02/04/24 124/70         Passed - Last Heart Rate in normal range    Pulse Readings from Last 1 Encounters:  02/04/24 79          Passed - Valid encounter within last 6 months    Recent Outpatient Visits           1 month ago Viral upper respiratory tract infection   [place] [facility] at [facility], Nyoka Cowden, MD               furosemide (LASIX) 20 MG tablet 90 tablet 0    Sig: Take 1 tablet (20 mg total) by mouth daily.     Cardiovascular:  Diuretics - Loop Failed - 03/20/2024  3:29 PM      Failed - K in normal range and within 180 days    Potassium  Date Value Ref Range Status  05/29/2022 4.1 3.5 - 5.1 mEq/L Final  04/18/2014 3.6 3.5 - 5.1 mmol/L Final  Failed - Ca in normal range and within 180 days    Calcium  Date Value Ref Range Status  12/02/2021 9.5 8.7 - 10.2 mg/dL Final   Calcium, Total  Date Value Ref Range Status  04/18/2014 8.7 8.5 - 10.1 mg/dL Final         Failed - Na in normal range and within 180 days    Sodium  Date Value Ref Range Status  05/29/2022 143 137 - 147 Final  04/18/2014 142 136 - 145 mmol/L Final         Failed - Cr in normal range and within 180 days    Creatinine  Date Value Ref Range Status  05/29/2022 1.2 0.6 - 1.3 Final  04/18/2014 1.41 (H) 0.60 - 1.30 mg/dL Final   Creatinine, Ser  Date Value Ref Range Status  12/02/2021 1.37 (H) 0.76 - 1.27 mg/dL Final         Failed - Cl in normal range and within 180 days    Chloride  Date Value Ref Range Status  05/29/2022 106 99 - 108 Final  04/18/2014 106 98 - 107 mmol/L Final         Failed - Mg Level in normal range and within 180 days    No results found for: "MG"       Passed - Last BP in normal range    BP Readings from Last 1 Encounters:  02/04/24 124/70         Passed - Valid encounter within last 6 months    Recent Outpatient Visits           1 month ago Viral upper respiratory tract infection   [place] [facility] at [facility], Nyoka Cowden, MD               losartan (COZAAR) 100 MG tablet 30 tablet 0     Sig: Take 1 tablet (100 mg total) by mouth daily.     Cardiovascular:  Angiotensin Receptor Blockers Failed - 03/20/2024  3:29 PM      Failed - Cr in normal range and within 180 days    Creatinine  Date Value Ref Range Status  05/29/2022 1.2 0.6 - 1.3 Final  04/18/2014 1.41 (H) 0.60 - 1.30 mg/dL Final   Creatinine, Ser  Date Value Ref Range Status  12/02/2021 1.37 (H) 0.76 - 1.27 mg/dL Final         Failed - K in normal range and within 180 days    Potassium  Date Value Ref Range Status  05/29/2022 4.1 3.5 - 5.1 mEq/L Final  04/18/2014 3.6 3.5 - 5.1 mmol/L Final         Passed - Patient is not pregnant      Passed - Last BP in normal range    BP Readings from Last 1 Encounters:  02/04/24 124/70         Passed - Valid encounter within last 6 months    Recent Outpatient Visits           1 month ago Viral upper respiratory tract infection   [place] [facility] at [facility], Nyoka Cowden, MD

## 2024-03-24 ENCOUNTER — Other Ambulatory Visit: Payer: Self-pay

## 2024-03-24 DIAGNOSIS — I10 Essential (primary) hypertension: Secondary | ICD-10-CM

## 2024-03-24 MED ORDER — CARVEDILOL 25 MG PO TABS: 25.0000 mg | ORAL_TABLET | Freq: Two times a day (BID) | ORAL | 0 refills | Status: DC

## 2024-03-24 MED ORDER — FUROSEMIDE 20 MG PO TABS: 20.0000 mg | ORAL_TABLET | Freq: Every day | ORAL | 0 refills | Status: DC

## 2024-03-24 MED ORDER — AMLODIPINE BESYLATE 10 MG PO TABS
10.0000 mg | ORAL_TABLET | Freq: Every day | ORAL | 0 refills | Status: DC
Start: 2024-03-24 — End: 2024-04-29

## 2024-03-24 MED ORDER — LOSARTAN POTASSIUM 100 MG PO TABS
100.0000 mg | ORAL_TABLET | Freq: Every day | ORAL | 0 refills | Status: DC
Start: 2024-03-24 — End: 2024-06-18

## 2024-03-24 NOTE — Telephone Encounter (Signed)
 Copied from CRM [id number]. Topic: Clinical - Prescription Issue >> Mar 24, 2024  9:48 AM Sonny Dandy B wrote: Reason for CRM: pt called to follow up with medication Refill. Please give pt a call back.at pt states he does not have any of his medication. Needs them to be refilled pt call last week 4/2 still no medication. Please call [phone]

## 2024-03-24 NOTE — Telephone Encounter (Signed)
 Med refill.   Appointment has been scheduled for patient for 04/02/2024. Patient is aware and verbalized understanding.

## 2024-04-02 ENCOUNTER — Ambulatory Visit: Admitting: Internal Medicine

## 2024-04-09 ENCOUNTER — Ambulatory Visit: Admitting: Internal Medicine

## 2024-04-10 ENCOUNTER — Other Ambulatory Visit: Payer: Self-pay | Admitting: Internal Medicine

## 2024-04-10 ENCOUNTER — Encounter: Payer: Self-pay | Admitting: Internal Medicine

## 2024-04-10 NOTE — Progress Notes (Unsigned)
 Date:  04/10/2024   Name:  Bradley Love   DOB:  21-Oct-1966   MRN:  [id number]   Chief Complaint: No chief complaint on file.  HPI  Review of Systems   Lab Results  Component Value Date   NA 143 05/29/2022   K 4.1 05/29/2022   CO2 28 (A) 05/29/2022   GLUCOSE 138 (H) 12/02/2021   BUN 23 (A) 05/29/2022   CREATININE 1.2 05/29/2022   CALCIUM 9.5 12/02/2021   EGFR 71 05/29/2022   GFRNONAA 59 (L) 11/29/2020   Lab Results  Component Value Date   CHOL 166 05/29/2022   HDL 43 05/29/2022   LDLCALC 101 05/29/2022   TRIG 110 05/29/2022   CHOLHDL 4.7 12/02/2021   Lab Results  Component Value Date   TSH 2.02 05/29/2022   Lab Results  Component Value Date   HGBA1C 5.1 05/29/2022   Lab Results  Component Value Date   WBC 10.2 05/29/2022   HGB 12.0 (A) 05/29/2022   HCT 34 (A) 05/29/2022   MCV 68 (L) 12/02/2021   PLT 105 (A) 05/29/2022   Lab Results  Component Value Date   ALT 24 05/29/2022   AST 12 (A) 05/29/2022   ALKPHOS 64 05/29/2022   BILITOT 1.4 (H) 12/02/2021   Lab Results  Component Value Date   VD25OH 17.5 05/29/2022     Patient Active Problem List   Diagnosis Date Noted   Bilateral primary osteoarthritis of knee 11/30/2023   Vitamin D  deficiency 06/12/2022   Acquired thrombophilia (HCC) 06/12/2022   Paroxysmal atrial fibrillation (HCC) 12/02/2021   Mild hyperlipidemia 12/02/2021   CSF leak 03/17/2021   Obstructive sleep apnea syndrome 02/07/2019   Family history of sickle cell trait 10/31/2017   Absolute anemia 09/13/2016   Essential (primary) hypertension 07/27/2015    No Known Allergies  Past Surgical History:  Procedure Laterality Date   CHOLECYSTECTOMY  12/18/2000   CORNEAL TRANSPLANT Right 12/18/2008   LAPAROSCOPIC GASTRIC SLEEVE RESECTION  01/03/2023   SINUS EXPLORATION Bilateral 03/2021   for CSF leak    Social History   Tobacco Use   Smoking status: Former    Current packs/day: 0.00    Types: Cigarettes    Quit date:  1997    Years since quitting: 28.3   Smokeless tobacco: Never  Vaping Use   Vaping status: Never Used  Substance Use Topics   Alcohol use: Yes    Alcohol/week: 0.0 standard drinks of alcohol    Comment: occasionally -3-4x/yr   Drug use: No     Medication list has been reviewed and updated.  No outpatient medications have been marked as taking for the 04/10/24 encounter (Orders Only) with Sheron Dixons, MD.       07/17/2023    9:51 AM 03/09/2023    1:33 PM 11/07/2022    3:55 PM 06/12/2022    3:24 PM  GAD 7 : Generalized Anxiety Score  Nervous, Anxious, on Edge 0 0 0 0  Control/stop worrying 0 0 0 0  Worry too much - different things 0 0 0 1  Trouble relaxing 0 0 0 1  Restless 0 0 0 1  Easily annoyed or irritable 0 0 0 0  Afraid - awful might happen 0 0 0 0  Total GAD 7 Score 0 0 0 3  Anxiety Difficulty Not difficult at all Not difficult at all Not difficult at all Not difficult at all       07/17/2023  9:51 AM 03/09/2023    1:32 PM 11/07/2022    3:55 PM  Depression screen PHQ 2/9  Decreased Interest 0 0 0  Down, Depressed, Hopeless 0 0 0  PHQ - 2 Score 0 0 0  Altered sleeping 0 0 0  Tired, decreased energy 0 0 0  Change in appetite 1 0 0  Feeling bad or failure about yourself  0 0 0  Trouble concentrating 0 0 0  Moving slowly or fidgety/restless 0 0 0  Suicidal thoughts 0 0 0  PHQ-9 Score 1 0 0  Difficult doing work/chores Not difficult at all Not difficult at all Not difficult at all    BP Readings from Last 3 Encounters:  02/04/24 124/70  07/17/23 128/74  03/09/23 128/70    Physical Exam  Wt Readings from Last 3 Encounters:  02/04/24 254 lb (115.2 kg)  07/17/23 259 lb (117.5 kg)  03/09/23 291 lb (132 kg)    There were no vitals taken for this visit.  Assessment and Plan:  Problem List Items Addressed This Visit   None   No follow-ups on file.    Sheron Dixons, MD [facility] [place]

## 2024-04-11 ENCOUNTER — Ambulatory Visit: Admitting: Internal Medicine

## 2024-04-24 LAB — BASIC METABOLIC PANEL WITH GFR
BUN: 30 — AB (ref 4–21)
CO2: 26 — AB (ref 13–22)
Chloride: 108 (ref 99–108)
Creatinine: 0.3 — AB (ref 0.6–1.3)
Glucose: 101
Potassium: 4.1 meq/L (ref 3.5–5.1)
Sodium: 148 — AB (ref 137–147)

## 2024-04-24 LAB — CBC AND DIFFERENTIAL
HCT: 39 — AB (ref 41–53)
Hemoglobin: 13.9 (ref 13.5–17.5)
Platelets: 115 10*3/uL — AB (ref 150–400)
WBC: 9.5

## 2024-04-24 LAB — IRON,TIBC AND FERRITIN PANEL
%SAT: 34
Iron: 57
TIBC: 138

## 2024-04-24 LAB — TSH: TSH: 3.24 (ref 0.41–5.90)

## 2024-04-24 LAB — COMPREHENSIVE METABOLIC PANEL WITH GFR
Calcium: 9.1 (ref 8.7–10.7)
eGFR: 62

## 2024-04-28 ENCOUNTER — Telehealth: Payer: Self-pay

## 2024-04-28 NOTE — Transitions of Care (Post Inpatient/ED Visit) (Signed)
 04/28/2024  Name: Bradley Love MRN: [id number] DOB: 1966-11-14  Today's TOC FU Call Status: Today's TOC FU Call Status:: Successful TOC FU Call Completed TOC FU Call Complete Date: 04/28/24 Patient's Name and Date of Birth confirmed.  Transition Care Management Follow-up Telephone Call Date of Discharge: 04/26/24 Discharge Facility: Other [facility]) Name of Other ([facility]) Discharge Facility: [facility] Type of Discharge: Inpatient Admission Primary Inpatient Discharge Diagnosis:: afib How have you been since you were released from the hospital?: Better Any questions or concerns?: No  Items Reviewed: Did you receive and understand the discharge instructions provided?: Yes Medications obtained,verified, and reconciled?: Yes (Medications Reviewed) Any new allergies since your discharge?: No Dietary orders reviewed?: Yes Do you have support at home?: Yes People in Home [RPT]: spouse  Medications Reviewed Today: Medications Reviewed Today     Reviewed by Darrall Ellison, LPN (Licensed Practical Nurse) on 04/28/24 at 1130  Med List Status: <None>   Medication Order Taking? Sig Documenting Provider Last Dose Status Informant  albuterol  (VENTOLIN  HFA) 108 (90 Base) MCG/ACT inhaler [id number]  Inhale 2 puffs into the lungs 4 (four) times daily as needed. Sheron Dixons, MD  Active   amLODipine  (NORVASC ) 10 MG tablet [id number]  Take 1 tablet (10 mg total) by mouth daily. Sheron Dixons, MD  Active   apixaban  (ELIQUIS ) 5 MG TABS tablet [id number] No Take 1 tablet (5 mg total) by mouth 2 (two) times daily. Sheron Dixons, MD Taking Expired 02/13/24 2359   carvedilol  (COREG ) 25 MG tablet [id number]  Take 1 tablet (25 mg total) by mouth 2 (two) times daily. Sheron Dixons, MD  Active   diclofenac Sodium (VOLTAREN) 1 % GEL [id number]  APPLY 2 GRAMS TO THE AFFECTED AREA(S) BY TOPICAL ROUTE 4 TIMES PER DAY [provider]  Active   diltiazem  (CARDIZEM ) 30 MG tablet  [id number] No Take 1 tablet (30 mg total) by mouth daily as needed (Atrial fibrillation). Sheron Dixons, MD Taking Active   furosemide  (LASIX ) 20 MG tablet [id number]  Take 1 tablet (20 mg total) by mouth daily. Sheron Dixons, MD  Active   losartan  (COZAAR ) 100 MG tablet [id number]  Take 1 tablet (100 mg total) by mouth daily. Sheron Dixons, MD  Active   Multiple Vitamins-Minerals (MULTIVITAL PO) [id number] No Take by mouth daily. [provider] Taking Active Self  Omega 3 1200 MG CAPS [id number] No Take 1 capsule by mouth daily. [provider] Taking Active Self           Med Note Sheron Dixons   Tue Dec 28, 2015  1:34 PM) Received from: [facility] Received Sig: Take by mouth.  sildenafil  (VIAGRA ) 50 MG tablet [id number] No Take 0.5 tablets (25 mg total) by mouth daily as needed for erectile dysfunction. Sheron Dixons, MD Taking Active   UNABLE TO FIND [id number] No Med Name: CPAP @@ 4 cm H2O [provider] Taking Active Self            Home Care and Equipment/Supplies: Were Home Health Services Ordered?: NA Any new equipment or medical supplies ordered?: NA  Functional Questionnaire: Do you need assistance with bathing/showering or dressing?: No Do you need assistance with meal preparation?: No Do you need assistance with eating?: No Do you have difficulty maintaining continence: No Do you need assistance with getting out of bed/getting out of a chair/moving?: No Do you have difficulty managing or taking your medications?: No  Follow up  appointments reviewed: PCP Follow-up appointment confirmed?: Yes Date of PCP follow-up appointment?: 04/29/24 Follow-up Provider: [facility] Follow-up appointment confirmed?: No Reason Specialist Follow-Up Not Confirmed: Patient has Specialist Provider Number and will Call for Appointment Do you need transportation to your follow-up appointment?: No Do you understand  care options if your condition(s) worsen?: Yes-patient verbalized understanding    SIGNATURE Darrall Ellison, LPN [facility] Nurse Health Advisor Direct Dial [phone]

## 2024-04-29 ENCOUNTER — Encounter: Payer: Self-pay | Admitting: Internal Medicine

## 2024-04-29 ENCOUNTER — Ambulatory Visit: Admitting: Internal Medicine

## 2024-04-29 VITALS — BP 124/78 | HR 114 | Ht 72.0 in | Wt 264.2 lb

## 2024-04-29 DIAGNOSIS — I10 Essential (primary) hypertension: Secondary | ICD-10-CM

## 2024-04-29 DIAGNOSIS — I480 Paroxysmal atrial fibrillation: Secondary | ICD-10-CM | POA: Diagnosis not present

## 2024-04-29 NOTE — Progress Notes (Signed)
 Date:  04/29/2024   Name:  Bradley Love   DOB:  [birth year]   MRN:  [id number]   Chief Complaint: Hospitalization Follow-up Admitted to [facility] 04/24/24 to 04/26/24.  TOC call done on 04/28/24.  Hospital Course:   Bradley Love is a 58 y.o. male who presented to [facility] with Atrial fibrillation with RVR .  Atrial fibrillation with RVR  Follows with [facility]. Uncontrolled RVR beginning 5/7, and unresponsive to as needed diltiazem  x 2 evaluation. Had some early chest tightness that has since resolved. Cardiac workup reassuring hsTroponin is flat (19 > 17). ProBNP elevated to 2,762 but he is euvolemic on exam. EKGs reviewed showing atrial fibrillation with rapid ventricular response, occasional PVC. No identifiable triggers (see HPI) and he has not missed any doses of medication. He is s/p 1.5 L of fluid with some mild improvement in heart rate and blood pressure. - Start metoprolol 12.5 mg every 6 hours scheduled, increase to 50 mg  - Hold home Coreg   - Encourage oral intake, hold on further IV fluids - Strict intake and output - Replete electrolytes to maintain K > 4, Mg > 2 - Monitor on telemetry for 24 hours - Discharge on Metoprolol Xl 200 Daily and Diltiazem  CR 120 Daily  Hypotension- resolved Mild asymptomatic hypotension (blood pressure 91/68 on presentation). In the ED, he was hypovolemic on exam, euvolemic on my exam s/p 1.5 L fluid resuscitation.  - Encourage oral intake and address rate control as above - Hold diuretics and antihypertensives as below - Continue to hold diuretics on discharge, PCP to resume if needed  Hypernatremia Mild hypernatremia to 147 on intake.  -Monitor  OSA: Continue home CPAP (home settings of 4 cm H2O)  HTN:  - Hold home Coreg  25 mg BID - changed to Metoprolol XL  - Hold home losartan  100 mg daily - resumed on discharge - Hold amlodipine  10 mg daily - changed to Diltizem CR - Hold lasix  20 mg daily - keep holding till PCP  visit  Microcytosis: MCV noted to be 66.5, but chronically so. He has not had any workup to his knowledge.  - Check iron studies as this might be contributing (has history of gastric bypass) - Check hgb/thalassemia profile  CKD 3: Baseline Cr appears to be ~ 1.2-1.5. Initial labs with Cr of 1.53. Thrombocytopenia: Chronic, stable  Discharge Medications:  Your Medication List   PAUSE taking these medications   furosemide  20 MG tablet Wait to take this until your doctor or other care provider tells you to start again. Commonly known as: LASIX  Take 1 tablet (20 mg total) by mouth daily.   STOP taking these medications   amlodipine  10 MG tablet Commonly known as: NORVASC   carvedilol  25 MG tablet Commonly known as: COREG   dilTIAZem  30 MG tablet Commonly known as: CARDIZEM  Replaced by: dilTIAZem  120 MG 24 hr capsule     START taking these medications   dilTIAZem  120 MG 24 hr capsule Commonly known as: CARDIZEM  CD Take 1 capsule (120 mg total) by mouth nightly. Replaces: dilTIAZem  30 MG tablet  metoPROLOL succinate 100 MG 24 hr tablet Commonly known as: TOPROL XL Take 2 tablets (200 mg total) by mouth in the morning.     CHANGE how you take these medications   acetaminophen  325 MG tablet Commonly known as: TYLENOL  Take 1 tablet (325 mg total) by mouth as needed for pain. What changed: Another medication with the same name was removed. Continue taking  this medication, and follow the directions you see here.     CONTINUE taking these medications   albuterol  90 mcg/actuation inhaler Commonly known as: PROVENTIL  HFA;VENTOLIN  HFA Inhale 2 puffs every six (6) hours as needed.  apixaban  5 mg Tab Commonly known as: ELIQUIS  Take 1 tablet (5 mg total) by mouth two (2) times a day.  losartan  100 MG tablet Commonly known as: COZAAR  Take 1 tablet (100 mg total) by mouth daily.  promethazine -dextromethorphan 6.25-15 mg/5 mL syrup Commonly known as:  PROMETHAZINE -DM Take 5 mL by mouth four (4) times a day as needed for cough.  sildenafil  50 MG tablet Commonly known as: VIAGRA  Take 0.5 tablets (25 mg total) by mouth daily as needed.  Hypertension This is a chronic problem. The problem is controlled. Pertinent negatives include no chest pain, headaches, palpitations or shortness of breath. Past treatments include calcium channel blockers, beta blockers and diuretics. The current treatment provides significant improvement. There are no compliance problems.   Heart Problem This is a new problem. The current episode started in the past 7 days. The problem has been resolved (no further episodes of Afib). Pertinent negatives include no abdominal pain, chest pain, chills, coughing, fatigue, headaches or weakness.    Review of Systems  Constitutional:  Negative for chills, fatigue and unexpected weight change.  HENT:  Negative for nosebleeds.   Eyes:  Negative for visual disturbance.  Respiratory:  Negative for cough, chest tightness, shortness of breath and wheezing.   Cardiovascular:  Negative for chest pain, palpitations and leg swelling.  Gastrointestinal:  Negative for abdominal pain, constipation and diarrhea.  Neurological:  Negative for dizziness, weakness, light-headedness and headaches.  Psychiatric/Behavioral:  Negative for dysphoric mood and sleep disturbance. The patient is not nervous/anxious.      Lab Results  Component Value Date   NA 143 05/29/2022   K 4.1 05/29/2022   CO2 28 (A) 05/29/2022   GLUCOSE 138 (H) 12/02/2021   BUN 23 (A) 05/29/2022   CREATININE 1.2 05/29/2022   CALCIUM 9.5 12/02/2021   EGFR 71 05/29/2022   GFRNONAA 59 (L) 11/29/2020   Lab Results  Component Value Date   CHOL 166 05/29/2022   HDL 43 05/29/2022   LDLCALC 101 05/29/2022   TRIG 110 05/29/2022   CHOLHDL 4.7 12/02/2021   Lab Results  Component Value Date   TSH 2.02 05/29/2022   Lab Results  Component Value Date   HGBA1C 5.1  05/29/2022   Lab Results  Component Value Date   WBC 10.2 05/29/2022   HGB 12.0 (A) 05/29/2022   HCT 34 (A) 05/29/2022   MCV 68 (L) 12/02/2021   PLT 105 (A) 05/29/2022   Lab Results  Component Value Date   ALT 24 05/29/2022   AST 12 (A) 05/29/2022   ALKPHOS 64 05/29/2022   BILITOT 1.4 (H) 12/02/2021   Lab Results  Component Value Date   VD25OH 17.5 05/29/2022     Patient Active Problem List   Diagnosis Date Noted   Bilateral primary osteoarthritis of knee 11/30/2023   Vitamin D  deficiency 06/12/2022   Acquired thrombophilia (HCC) 06/12/2022   Paroxysmal atrial fibrillation (HCC) 12/02/2021   Mild hyperlipidemia 12/02/2021   CSF leak 03/17/2021   Obstructive sleep apnea syndrome 02/07/2019   Family history of sickle cell trait 10/31/2017   Absolute anemia 09/13/2016   Essential (primary) hypertension 07/27/2015    No Known Allergies  Past Surgical History:  Procedure Laterality Date   CHOLECYSTECTOMY  12/18/2000   CORNEAL TRANSPLANT  Right 12/18/2008   LAPAROSCOPIC GASTRIC SLEEVE RESECTION  01/03/2023   SINUS EXPLORATION Bilateral 03/2021   for CSF leak    Social History   Tobacco Use   Smoking status: Former    Current packs/day: 0.00    Types: Cigarettes    Quit date: 1997    Years since quitting: 28.3   Smokeless tobacco: Never  Vaping Use   Vaping status: Never Used  Substance Use Topics   Alcohol use: Yes    Alcohol/week: 0.0 standard drinks of alcohol    Comment: occasionally -3-4x/yr   Drug use: No     Medication list has been reviewed and updated.  Current Meds  Medication Sig   albuterol  (VENTOLIN  HFA) 108 (90 Base) MCG/ACT inhaler Inhale 2 puffs into the lungs 4 (four) times daily as needed.   diclofenac Sodium (VOLTAREN) 1 % GEL APPLY 2 GRAMS TO THE AFFECTED AREA(S) BY TOPICAL ROUTE 4 TIMES PER DAY   diltiazem  (CARDIZEM  CD) 120 MG 24 hr capsule Take 120 mg by mouth.   losartan  (COZAAR ) 100 MG tablet Take 1 tablet (100 mg total) by  mouth daily.   metoprolol succinate (TOPROL-XL) 100 MG 24 hr tablet Take 200 mg by mouth daily.   Multiple Vitamins-Minerals (MULTIVITAL PO) Take by mouth daily.   Omega 3 1200 MG CAPS Take 1 capsule by mouth daily.   sildenafil  (VIAGRA ) 50 MG tablet Take 0.5 tablets (25 mg total) by mouth daily as needed for erectile dysfunction.   UNABLE TO FIND Med Name: CPAP @@ 4 cm H2O       04/29/2024    1:23 PM 07/17/2023    9:51 AM 03/09/2023    1:33 PM 11/07/2022    3:55 PM  GAD 7 : Generalized Anxiety Score  Nervous, Anxious, on Edge 0 0 0 0  Control/stop worrying 0 0 0 0  Worry too much - different things 0 0 0 0  Trouble relaxing 0 0 0 0  Restless 0 0 0 0  Easily annoyed or irritable 0 0 0 0  Afraid - awful might happen 0 0 0 0  Total GAD 7 Score 0 0 0 0  Anxiety Difficulty Not difficult at all Not difficult at all Not difficult at all Not difficult at all       04/29/2024    1:23 PM 07/17/2023    9:51 AM 03/09/2023    1:32 PM  Depression screen PHQ 2/9  Decreased Interest 0 0 0  Down, Depressed, Hopeless 0 0 0  PHQ - 2 Score 0 0 0  Altered sleeping 0 0 0  Tired, decreased energy 0 0 0  Change in appetite 1 1 0  Feeling bad or failure about yourself  0 0 0  Trouble concentrating 0 0 0  Moving slowly or fidgety/restless 0 0 0  Suicidal thoughts 0 0 0  PHQ-9 Score 1 1 0  Difficult doing work/chores Not difficult at all Not difficult at all Not difficult at all    BP Readings from Last 3 Encounters:  04/29/24 124/78  02/04/24 124/70  07/17/23 128/74    Physical Exam Vitals and nursing note reviewed.  Constitutional:      General: He is not in acute distress.    Appearance: Normal appearance. He is well-developed.  HENT:     Head: Normocephalic and atraumatic.  Cardiovascular:     Rate and Rhythm: Regular rhythm. Tachycardia present.     Pulses: Normal pulses.     Heart  sounds: No murmur heard. Pulmonary:     Effort: Pulmonary effort is normal. No respiratory  distress.     Breath sounds: No wheezing or rhonchi.  Abdominal:     Palpations: Abdomen is soft.  Musculoskeletal:     Cervical back: Normal range of motion.     Right lower leg: No edema.     Left lower leg: No edema.  Lymphadenopathy:     Cervical: No cervical adenopathy.  Skin:    General: Skin is warm and dry.     Capillary Refill: Capillary refill takes less than 2 seconds.     Findings: No rash.  Neurological:     General: No focal deficit present.     Mental Status: He is alert and oriented to person, place, and time.  Psychiatric:        Mood and Affect: Mood normal.        Behavior: Behavior normal.     Wt Readings from Last 3 Encounters:  04/29/24 264 lb 4 oz (119.9 kg)  02/04/24 254 lb (115.2 kg)  07/17/23 259 lb (117.5 kg)    BP 124/78   Pulse (!) 114   Ht 6' (1.829 m)   Wt 264 lb 4 oz (119.9 kg)   SpO2 97%   BMI 35.84 kg/m   Assessment and Plan:  Problem List Items Addressed This Visit       Unprioritized   Essential (primary) hypertension (Chronic)   Blood pressure is well controlled.  Current medications are Cardiazem, Metoprolol and lasix  (currently being held) Will continue same regimen along with efforts to limit dietary sodium. Continue to hold lasix  due to mild tachycardia and no edema or weight gain. Follow up in one month and prn.       Relevant Medications   metoprolol succinate (TOPROL-XL) 100 MG 24 hr tablet   diltiazem  (CARDIZEM  CD) 120 MG 24 hr capsule   Paroxysmal atrial fibrillation (HCC) - Primary (Chronic)   Converted to SR and in SR today on exam. Mild tachycardia so will not resume Lasix . Recommend Cardiology follow up within the month.      Relevant Medications   metoprolol succinate (TOPROL-XL) 100 MG 24 hr tablet   diltiazem  (CARDIZEM  CD) 120 MG 24 hr capsule    Return in about 1 month (around 05/30/2024) for HTN.    Sheron Dixons, MD [facility] [place]

## 2024-04-29 NOTE — Assessment & Plan Note (Signed)
 Blood pressure is well controlled.  Current medications are Cardiazem, Metoprolol and lasix  (currently being held) Will continue same regimen along with efforts to limit dietary sodium. Continue to hold lasix  due to mild tachycardia and no edema or weight gain. Follow up in one month and prn.

## 2024-04-29 NOTE — Assessment & Plan Note (Signed)
 Converted to SR and in SR today on exam. Mild tachycardia so will not resume Lasix . Recommend Cardiology follow up within the month.

## 2024-05-16 ENCOUNTER — Telehealth: Payer: Self-pay | Admitting: Internal Medicine

## 2024-05-16 NOTE — Telephone Encounter (Signed)
 Copied from CRM [id number]. Topic: Clinical - Medication Refill >> May 16, 2024  3:31 PM Oddis Bench wrote: Medication:  diltiazem  (CARDIZEM  CD) 120 MG 24 hr capsule metoprolol succinate (TOPROL-XL) 100 MG 24 hr tablet   Has the patient contacted their pharmacy? No)  This is the patient's preferred pharmacy:  [facility] [address] ([address]), [place] - [address] [place]) [place] [address] Phone: [phone] Fax: [phone]   Is this the correct pharmacy for this prescription? Yes If no, delete pharmacy and type the correct one.   Has the prescription been filled recently? No  Is the patient out of the medication? Yes  Has the patient been seen for an appointment in the last year OR does the patient have an upcoming appointment? Yes  Can we respond through MyChart? Yes  Agent: Please be advised that Rx refills may take up to 3 business days. We ask that you follow-up with your pharmacy.

## 2024-05-19 MED ORDER — DILTIAZEM HCL ER COATED BEADS 120 MG PO CP24: 120.0000 mg | ORAL_CAPSULE | Freq: Every day | ORAL | 0 refills | Status: DC

## 2024-05-19 NOTE — Telephone Encounter (Signed)
 Requested medication (s) are due for refill today: yes  Requested medication (s) are on the active medication list: no  Last refill:  04/29/24  Future visit scheduled: yes  Notes to clinic:  Unable to refill per protocol, last refill by another/historical provider.      Requested Prescriptions  Pending Prescriptions Disp Refills   diltiazem  (CARDIZEM  CD) 120 MG 24 hr capsule      Sig: Take 1 capsule (120 mg total) by mouth.     Cardiovascular: Calcium Channel Blockers 3 Failed - 05/19/2024 10:56 AM      Failed - ALT in normal range and within 360 days    ALT  Date Value Ref Range Status  05/29/2022 24 10 - 40 U/L Final         Failed - AST in normal range and within 360 days    AST  Date Value Ref Range Status  05/29/2022 12 (A) 14 - 40 Final         Failed - Cr in normal range and within 360 days    Creatinine  Date Value Ref Range Status  05/29/2022 1.2 0.6 - 1.3 Final  04/18/2014 1.41 (H) 0.60 - 1.30 mg/dL Final   Creatinine, Ser  Date Value Ref Range Status  12/02/2021 1.37 (H) 0.76 - 1.27 mg/dL Final         Failed - Last Heart Rate in normal range    Pulse Readings from Last 1 Encounters:  04/29/24 (!) 114         Passed - Last BP in normal range    BP Readings from Last 1 Encounters:  04/29/24 124/78         Passed - Valid encounter within last 6 months    Recent Outpatient Visits           2 weeks ago Paroxysmal atrial fibrillation [facility])   [place] [facility] at [facility], Chales [place], MD   3 months ago Viral upper respiratory tract infection   [place] [facility] at [facility], Chales [place], MD       Future Appointments             In 1 week Gala Jubilee, Chales [place], MD [facility] at [facility], [facility]

## 2024-05-19 NOTE — Telephone Encounter (Signed)
Please review medication refill  request

## 2024-05-23 ENCOUNTER — Other Ambulatory Visit: Payer: Self-pay | Admitting: Internal Medicine

## 2024-05-23 MED ORDER — METOPROLOL SUCCINATE ER 100 MG PO TB24: 200.0000 mg | ORAL_TABLET | Freq: Every day | ORAL | 0 refills | Status: DC

## 2024-05-23 NOTE — Telephone Encounter (Signed)
 Copied from CRM [id number]. Topic: Clinical - Medication Refill >> May 23, 2024  9:02 AM Bradley Love wrote: Medication:  metoprolol succinate (TOPROL-XL) 100 MG 24 hr tablet  Has the patient contacted their pharmacy? Yes (Agent: If no, request that the patient contact the pharmacy for the refill. If patient does not wish to contact the pharmacy document the reason why and proceed with request.) (Agent: If yes, when and what did the pharmacy advise?)  This is the patient's preferred pharmacy:  [facility] [address]), [place] - [address] [name] Bradley Love) [place] [address] Phone: [phone] Fax: [phone]  Is this the correct pharmacy for this prescription? Yes If no, delete pharmacy and type the correct one.   Has the prescription been filled recently? Yes  Is the patient out of the medication? Yes  Has the patient been seen for an appointment in the last year OR does the patient have an upcoming appointment? Yes  Can we respond through MyChart? Yes  Agent: Please be advised that Rx refills may take up to 3 business days. We ask that you follow-up with your pharmacy.

## 2024-05-23 NOTE — Telephone Encounter (Signed)
Please review medication refill  request

## 2024-05-30 ENCOUNTER — Ambulatory Visit: Admitting: Internal Medicine

## 2024-05-30 ENCOUNTER — Encounter: Payer: Self-pay | Admitting: Internal Medicine

## 2024-06-17 ENCOUNTER — Other Ambulatory Visit: Payer: Self-pay | Admitting: Internal Medicine

## 2024-06-17 DIAGNOSIS — I10 Essential (primary) hypertension: Secondary | ICD-10-CM

## 2024-06-18 ENCOUNTER — Encounter: Payer: Self-pay | Admitting: Internal Medicine

## 2024-06-18 ENCOUNTER — Telehealth: Payer: Self-pay

## 2024-06-18 ENCOUNTER — Ambulatory Visit: Admitting: Internal Medicine

## 2024-06-18 VITALS — BP 125/78 | HR 108 | Ht 72.0 in | Wt 262.0 lb

## 2024-06-18 DIAGNOSIS — I10 Essential (primary) hypertension: Secondary | ICD-10-CM | POA: Diagnosis not present

## 2024-06-18 DIAGNOSIS — Z1211 Encounter for screening for malignant neoplasm of colon: Secondary | ICD-10-CM

## 2024-06-18 DIAGNOSIS — D582 Other hemoglobinopathies: Secondary | ICD-10-CM | POA: Diagnosis not present

## 2024-06-18 DIAGNOSIS — I480 Paroxysmal atrial fibrillation: Secondary | ICD-10-CM

## 2024-06-18 MED ORDER — DILTIAZEM HCL ER COATED BEADS 120 MG PO CP24: 120.0000 mg | ORAL_CAPSULE | Freq: Every day | ORAL | 1 refills | Status: DC

## 2024-06-18 MED ORDER — LOSARTAN POTASSIUM 100 MG PO TABS: 100.0000 mg | ORAL_TABLET | Freq: Every day | ORAL | 1 refills | Status: DC

## 2024-06-18 MED ORDER — METOPROLOL SUCCINATE ER 100 MG PO TB24
200.0000 mg | ORAL_TABLET | Freq: Every day | ORAL | 1 refills | Status: DC
Start: 2024-06-18 — End: 2024-11-11

## 2024-06-18 NOTE — Assessment & Plan Note (Signed)
 Remains in SR after cardioversion. Tolerating medications well. On Eliquis  without bleeding.

## 2024-06-18 NOTE — Telephone Encounter (Signed)
 I do not see any flonase  or other nasal spray on list. Pls advise if nasal spray is appropriate for pt.

## 2024-06-18 NOTE — Assessment & Plan Note (Signed)
 Blood pressure is well controlled.  Current medications metoprolol , Cardiazem and losartan . Will continue same regimen along with efforts to limit dietary sodium.

## 2024-06-18 NOTE — Progress Notes (Signed)
 Date:  06/18/2024   Name:  Bradley Love   DOB:  12-12-1966   MRN:  [id number]   Chief Complaint: Hypertension  Hypertension This is a chronic problem. Pertinent negatives include no chest pain, headaches, palpitations or shortness of breath. Past treatments include calcium channel blockers, beta blockers and angiotensin blockers. The current treatment provides significant improvement.    Review of Systems  Constitutional:  Negative for chills, fatigue and unexpected weight change.  HENT:  Negative for nosebleeds.   Eyes:  Negative for visual disturbance.  Respiratory:  Negative for cough, chest tightness, shortness of breath and wheezing.   Cardiovascular:  Negative for chest pain, palpitations and leg swelling.  Gastrointestinal:  Negative for abdominal pain, constipation and diarrhea.  Neurological:  Negative for dizziness, weakness, light-headedness and headaches.  Psychiatric/Behavioral:  Negative for dysphoric mood and sleep disturbance. The patient is not nervous/anxious.      Lab Results  Component Value Date   NA 148 (A) 04/24/2024   K 4.1 04/24/2024   CO2 26 (A) 04/24/2024   GLUCOSE 138 (H) 12/02/2021   BUN 30 (A) 04/24/2024   CREATININE 0.3 (A) 04/24/2024   CALCIUM 9.1 04/24/2024   EGFR 62 04/24/2024   GFRNONAA 59 (L) 11/29/2020   Lab Results  Component Value Date   CHOL 166 05/29/2022   HDL 43 05/29/2022   LDLCALC 101 05/29/2022   TRIG 110 05/29/2022   CHOLHDL 4.7 12/02/2021   Lab Results  Component Value Date   TSH 3.24 04/24/2024   Lab Results  Component Value Date   HGBA1C 5.1 05/29/2022   Lab Results  Component Value Date   WBC 9.5 04/24/2024   HGB 13.9 04/24/2024   HCT 39 (A) 04/24/2024   MCV 68 (L) 12/02/2021   PLT 115 (A) 04/24/2024   Lab Results  Component Value Date   ALT 24 05/29/2022   AST 12 (A) 05/29/2022   ALKPHOS 64 05/29/2022   BILITOT 1.4 (H) 12/02/2021   Lab Results  Component Value Date   VD25OH 17.5 05/29/2022      Patient Active Problem List   Diagnosis Date Noted   Hemoglobin C disease (HCC) 06/18/2024   Bilateral primary osteoarthritis of knee 11/30/2023   Vitamin D  deficiency 06/12/2022   Acquired thrombophilia (HCC) 06/12/2022   Paroxysmal atrial fibrillation (HCC) 12/02/2021   Mild hyperlipidemia 12/02/2021   CSF leak 03/17/2021   Obstructive sleep apnea syndrome 02/07/2019   Family history of sickle cell trait 10/31/2017   Essential (primary) hypertension 07/27/2015    No Known Allergies  Past Surgical History:  Procedure Laterality Date   CHOLECYSTECTOMY  12/18/2000   CORNEAL TRANSPLANT Right 12/18/2008   LAPAROSCOPIC GASTRIC SLEEVE RESECTION  01/03/2023   SINUS EXPLORATION Bilateral 03/2021   for CSF leak    Social History   Tobacco Use   Smoking status: Former    Current packs/day: 0.00    Types: Cigarettes    Quit date: 1997    Years since quitting: 28.5   Smokeless tobacco: Never  Vaping Use   Vaping status: Never Used  Substance Use Topics   Alcohol use: Yes    Alcohol/week: 0.0 standard drinks of alcohol    Comment: occasionally -3-4x/yr   Drug use: No     Medication list has been reviewed and updated.  Current Meds  Medication Sig   albuterol  (VENTOLIN  HFA) 108 (90 Base) MCG/ACT inhaler Inhale 2 puffs into the lungs 4 (four) times daily as needed.  apixaban  (ELIQUIS ) 5 MG TABS tablet Take 1 tablet (5 mg total) by mouth 2 (two) times daily.   diclofenac Sodium (VOLTAREN) 1 % GEL APPLY 2 GRAMS TO THE AFFECTED AREA(S) BY TOPICAL ROUTE 4 TIMES PER DAY   Multiple Vitamins-Minerals (MULTIVITAL PO) Take by mouth daily.   Omega 3 1200 MG CAPS Take 1 capsule by mouth daily.   sildenafil  (VIAGRA ) 50 MG tablet Take 0.5 tablets (25 mg total) by mouth daily as needed for erectile dysfunction.   UNABLE TO FIND Med Name: CPAP @@ 4 cm H2O   [DISCONTINUED] diltiazem  (CARDIZEM  CD) 120 MG 24 hr capsule Take 1 capsule (120 mg total) by mouth daily.   [DISCONTINUED]  losartan  (COZAAR ) 100 MG tablet Take 1 tablet (100 mg total) by mouth daily.   [DISCONTINUED] metoprolol  succinate (TOPROL -XL) 100 MG 24 hr tablet Take 2 tablets (200 mg total) by mouth daily.       06/18/2024   10:27 AM 04/29/2024    1:23 PM 07/17/2023    9:51 AM 03/09/2023    1:33 PM  GAD 7 : Generalized Anxiety Score  Nervous, Anxious, on Edge 0 0 0 0  Control/stop worrying 0 0 0 0  Worry too much - different things 0 0 0 0  Trouble relaxing 0 0 0 0  Restless 0 0 0 0  Easily annoyed or irritable 0 0 0 0  Afraid - awful might happen 0 0 0 0  Total GAD 7 Score 0 0 0 0  Anxiety Difficulty Not difficult at all Not difficult at all Not difficult at all Not difficult at all       06/18/2024   10:27 AM 04/29/2024    1:23 PM 07/17/2023    9:51 AM  Depression screen PHQ 2/9  Decreased Interest 0 0 0  Down, Depressed, Hopeless 0 0 0  PHQ - 2 Score 0 0 0  Altered sleeping 0 0 0  Tired, decreased energy 0 0 0  Change in appetite 0 1 1  Feeling bad or failure about yourself  0 0 0  Trouble concentrating 0 0 0  Moving slowly or fidgety/restless 0 0 0  Suicidal thoughts 0 0 0  PHQ-9 Score 0 1 1  Difficult doing work/chores Not difficult at all Not difficult at all Not difficult at all    BP Readings from Last 3 Encounters:  06/18/24 125/78  04/29/24 124/78  02/04/24 124/70    Physical Exam Vitals and nursing note reviewed.  Constitutional:      General: He is not in acute distress.    Appearance: He is well-developed. He is obese.  HENT:     Head: Normocephalic and atraumatic.  Cardiovascular:     Rate and Rhythm: Normal rate and regular rhythm.     Heart sounds: No murmur heard. Pulmonary:     Effort: Pulmonary effort is normal. No respiratory distress.     Breath sounds: No wheezing or rhonchi.  Musculoskeletal:     Cervical back: Normal range of motion.     Right lower leg: No edema.     Left lower leg: No edema.  Lymphadenopathy:     Cervical: No cervical adenopathy.   Skin:    General: Skin is warm and dry.     Findings: Lesion (stable papule anterior left lower leg) present. No rash.  Neurological:     General: No focal deficit present.     Mental Status: He is alert and oriented to person, place, and  time.  Psychiatric:        Mood and Affect: Mood normal.        Behavior: Behavior normal.     Wt Readings from Last 3 Encounters:  06/18/24 262 lb (118.8 kg)  04/29/24 264 lb 4 oz (119.9 kg)  02/04/24 254 lb (115.2 kg)    BP 125/78   Pulse (!) 108   Ht 6' (1.829 m)   Wt 262 lb (118.8 kg)   SpO2 100%   BMI 35.53 kg/m   Assessment and Plan:  Problem List Items Addressed This Visit       Unprioritized   Essential (primary) hypertension - Primary (Chronic)   Blood pressure is well controlled.  Current medications metoprolol , Cardiazem and losartan . Will continue same regimen along with efforts to limit dietary sodium.       Relevant Medications   losartan  (COZAAR ) 100 MG tablet   diltiazem  (CARDIZEM  CD) 120 MG 24 hr capsule   metoprolol  succinate (TOPROL -XL) 100 MG 24 hr tablet   Paroxysmal atrial fibrillation (HCC) (Chronic)   Remains in SR after cardioversion. Tolerating medications well. On Eliquis  without bleeding.      Relevant Medications   losartan  (COZAAR ) 100 MG tablet   diltiazem  (CARDIZEM  CD) 120 MG 24 hr capsule   metoprolol  succinate (TOPROL -XL) 100 MG 24 hr tablet   Hemoglobin C disease (HCC)   Other Visit Diagnoses       Colon cancer screening       Relevant Orders   Ambulatory referral to Gastroenterology       No follow-ups on file.    Leita HILARIO Adie, MD [facility] [place]

## 2024-06-18 NOTE — Telephone Encounter (Signed)
 No longer on current medication list- and not included in visit notes for today Requested Prescriptions  Pending Prescriptions Disp Refills   carvedilol  (COREG ) 25 MG tablet [Pharmacy Med Name: Carvedilol  25 MG Oral Tablet] 180 tablet 0    Sig: Take 1 tablet by mouth twice daily     Cardiovascular: Beta Blockers 3 Failed - 06/18/2024  3:33 PM      Failed - Cr in normal range and within 360 days    Creatinine  Date Value Ref Range Status  04/24/2024 0.3 (A) 0.6 - 1.3 Final  04/18/2014 1.41 (H) 0.60 - 1.30 mg/dL Final   Creatinine, Ser  Date Value Ref Range Status  12/02/2021 1.37 (H) 0.76 - 1.27 mg/dL Final         Failed - AST in normal range and within 360 days    AST  Date Value Ref Range Status  05/29/2022 12 (A) 14 - 40 Final         Failed - ALT in normal range and within 360 days    ALT  Date Value Ref Range Status  05/29/2022 24 10 - 40 U/L Final         Passed - Last BP in normal range    BP Readings from Last 1 Encounters:  06/18/24 125/78         Passed - Last Heart Rate in normal range    Pulse Readings from Last 1 Encounters:  06/18/24 (!) 108         Passed - Valid encounter within last 6 months    Recent Outpatient Visits           Today Essential (primary) hypertension   [place] [facility] at [facility], Leita DEL, MD   1 month ago Paroxysmal atrial fibrillation [facility])   [place] [facility] at [facility] Leita DEL, MD   4 months ago Viral upper respiratory tract infection   [facility] at [facility], Leita DEL, MD

## 2024-06-18 NOTE — Telephone Encounter (Signed)
 Copied from CRM [id number]. Topic: Clinical - Medication Question >> Jun 18, 2024  1:12 PM [name] wrote: Reason for CRM: pt called back in and asked if Dr Justus could call in allergy medication (mist in the nose) he forgot to ask.

## 2024-08-13 ENCOUNTER — Other Ambulatory Visit: Payer: Self-pay | Admitting: Internal Medicine

## 2024-08-13 DIAGNOSIS — N522 Drug-induced erectile dysfunction: Secondary | ICD-10-CM

## 2024-08-13 NOTE — Telephone Encounter (Unsigned)
 Copied from CRM #[id number]. Topic: Clinical - Medication Refill >> Aug 13, 2024  2:43 PM Fonda T wrote: Medication: sildenafil  (VIAGRA ) 50 MG tablet  Has the patient contacted their pharmacy? Yes, pharmacy advised to contact office   This is the patient's preferred pharmacy:  [facility] [address] ([address]), [place] - [address] [place]) [place] [address] Phone: [phone] Fax: [phone]   Is this the correct pharmacy for this prescription? Yes If no, delete pharmacy and type the correct one.   Has the prescription been filled recently? Yes  Is the patient out of the medication? Yes  Has the patient been seen for an appointment in the last year OR does the patient have an upcoming appointment? Yes  Can we respond through MyChart? Yes  Agent: Please be advised that Rx refills may take up to 3 business days. We ask that you follow-up with your pharmacy.

## 2024-08-15 MED ORDER — SILDENAFIL CITRATE 50 MG PO TABS: 25.0000 mg | ORAL_TABLET | Freq: Every day | ORAL | 0 refills | Status: DC | PRN

## 2024-08-15 NOTE — Telephone Encounter (Signed)
 Requested medications are due for refill today.  yes  Requested medications are on the active medications list.  yes  Last refill. 06/28/2023 #90 0 rf  Future visit scheduled.   yes  Notes to clinic.  Labs are expired.    Requested Prescriptions  Pending Prescriptions Disp Refills   sildenafil  (VIAGRA ) 50 MG tablet 90 tablet 0    Sig: Take 0.5 tablets (25 mg total) by mouth daily as needed for erectile dysfunction.     Urology: Erectile Dysfunction Agents Failed - 08/15/2024 12:06 PM      Failed - AST in normal range and within 360 days    AST  Date Value Ref Range Status  05/29/2022 12 (A) 14 - 40 Final         Failed - ALT in normal range and within 360 days    ALT  Date Value Ref Range Status  05/29/2022 24 10 - 40 U/L Final         Passed - Last BP in normal range    BP Readings from Last 1 Encounters:  06/18/24 125/78         Passed - Valid encounter within last 12 months    Recent Outpatient Visits           1 month ago Essential (primary) hypertension   [place] [facility] at [facility], Leita DEL, MD   3 months ago Paroxysmal atrial fibrillation [facility])   [place] [facility] Medicine at [facility], Leita DEL, MD   6 months ago Viral upper respiratory tract infection   [facility] Medicine at [facility], Leita DEL, MD

## 2024-09-16 ENCOUNTER — Other Ambulatory Visit: Payer: Self-pay | Admitting: Internal Medicine

## 2024-09-16 DIAGNOSIS — N522 Drug-induced erectile dysfunction: Secondary | ICD-10-CM

## 2024-09-16 NOTE — Telephone Encounter (Unsigned)
 Copied from CRM #[id number]. Topic: Clinical - Medication Refill >> Sep 16, 2024  3:17 PM Precious C wrote: Medication: sildenafil  (VIAGRA ) 50 MG tablet  Has the patient contacted their pharmacy? Yes (Agent: If no, request that the patient contact the pharmacy for the refill. If patient does not wish to contact the pharmacy document the reason why and proceed with request.) (Agent: If yes, when and what did the pharmacy advise?)  This is the patient's preferred pharmacy:  [facility] [address] ([address]), [place] - [address] [place]) [place] [address] Phone: [phone] Fax: [phone]   Is this the correct pharmacy for this prescription? Yes If no, delete pharmacy and type the correct one.   Has the prescription been filled recently? No  Is the patient out of the medication? Yes  Has the patient been seen for an appointment in the last year OR does the patient have an upcoming appointment? Yes  Can we respond through MyChart? Yes  Agent: Please be advised that Rx refills may take up to 3 business days. We ask that you follow-up with your pharmacy.

## 2024-09-18 NOTE — Telephone Encounter (Signed)
 Requested medication (s) are due for refill today: yes   Requested medication (s) are on the active medication list: yes   Last refill:  08/15/24 #30 0 refills   Future visit scheduled: yes 10/09/24  Notes to clinic:  no refills remain. Do you want to refill Rx?     Requested Prescriptions  Pending Prescriptions Disp Refills   sildenafil  (VIAGRA ) 50 MG tablet 30 tablet 0    Sig: Take 0.5 tablets (25 mg total) by mouth daily as needed for erectile dysfunction.     Urology: Erectile Dysfunction Agents Failed - 09/18/2024  2:21 PM      Failed - AST in normal range and within 360 days    AST  Date Value Ref Range Status  05/29/2022 12 (A) 14 - 40 Final         Failed - ALT in normal range and within 360 days    ALT  Date Value Ref Range Status  05/29/2022 24 10 - 40 U/L Final         Passed - Last BP in normal range    BP Readings from Last 1 Encounters:  06/18/24 125/78         Passed - Valid encounter within last 12 months    Recent Outpatient Visits           3 months ago Essential (primary) hypertension   [place] [facility] at [facility], Leita DEL, MD   4 months ago Paroxysmal atrial fibrillation [facility])   [place] [facility] Medicine at [facility], Leita DEL, MD   7 months ago Viral upper respiratory tract infection   [facility] at [facility], Leita DEL, MD

## 2024-09-26 ENCOUNTER — Other Ambulatory Visit: Payer: Self-pay | Admitting: Internal Medicine

## 2024-09-26 DIAGNOSIS — N522 Drug-induced erectile dysfunction: Secondary | ICD-10-CM

## 2024-09-26 MED ORDER — SILDENAFIL CITRATE 50 MG PO TABS: 25.0000 mg | ORAL_TABLET | Freq: Every day | ORAL | 0 refills | Status: AC | PRN

## 2024-09-26 NOTE — Telephone Encounter (Signed)
 Requested medication (s) are due for refill today: routing for review  Requested medication (s) are on the active medication list: yes  Last refill:  08/15/24  Future visit scheduled: yes  Notes to clinic:  routing  for review     Requested Prescriptions  Pending Prescriptions Disp Refills   sildenafil  (VIAGRA ) 50 MG tablet 30 tablet 0    Sig: Take 0.5 tablets (25 mg total) by mouth daily as needed for erectile dysfunction.     Urology: Erectile Dysfunction Agents Failed - 09/26/2024  2:03 PM      Failed - AST in normal range and within 360 days    AST  Date Value Ref Range Status  05/29/2022 12 (A) 14 - 40 Final         Failed - ALT in normal range and within 360 days    ALT  Date Value Ref Range Status  05/29/2022 24 10 - 40 U/L Final         Passed - Last BP in normal range    BP Readings from Last 1 Encounters:  06/18/24 125/78         Passed - Valid encounter within last 12 months    Recent Outpatient Visits           3 months ago Essential (primary) hypertension   [place] [facility] at [facility], Leita DEL, MD   5 months ago Paroxysmal atrial fibrillation [facility])   [place] [facility] Medicine at [facility], Leita DEL, MD   7 months ago Viral upper respiratory tract infection   [facility] Care [facility] Medicine at [facility], Leita DEL, MD

## 2024-09-26 NOTE — Telephone Encounter (Signed)
 Copied from CRM #[id number]. Topic: Clinical - Medication Refill >> Sep 16, 2024  3:17 PM Precious C wrote: Medication: sildenafil  (VIAGRA ) 50 MG tablet  Has the patient contacted their pharmacy? Yes (Agent: If no, request that the patient contact the pharmacy for the refill. If patient does not wish to contact the pharmacy document the reason why and proceed with request.) (Agent: If yes, when and what did the pharmacy advise?)  This is the patient's preferred pharmacy:  [facility] [address] ([address]), [place] - [address] [place]) [place] [address] Phone: [phone] Fax: [phone]   Is this the correct pharmacy for this prescription? Yes If no, delete pharmacy and type the correct one.   Has the prescription been filled recently? No  Is the patient out of the medication? Yes  Has the patient been seen for an appointment in the last year OR does the patient have an upcoming appointment? Yes  Can we respond through MyChart? Yes  Agent: Please be advised that Rx refills may take up to 3 business days. We ask that you follow-up with your pharmacy.

## 2024-09-29 ENCOUNTER — Telehealth: Payer: Self-pay

## 2024-09-29 NOTE — Telephone Encounter (Signed)
 Refill denied per Dr Justus until appt.

## 2024-09-29 NOTE — Telephone Encounter (Signed)
>>   Sep 29, 2024  2:52 PM Emylou G wrote: Patient called.. checking status of refill? Pls call patient to advise?  Copied from CRM #[id number]. Topic: Clinical - Medication Refill >> Sep 16, 2024  3:17 PM Precious C wrote: Medication: sildenafil  (VIAGRA ) 50 MG tablet  Has the patient contacted their pharmacy? Yes (Agent: If no, request that the patient contact the pharmacy for the refill. If patient does not wish to contact the pharmacy document the reason why and proceed with request.) (Agent: If yes, when and what did the pharmacy advise?)  This is the patient's preferred pharmacy:  [facility] [address] ([address]), [place] - [address] [place]) [place] [address] Phone: [phone] Fax: [phone]   Is this the correct pharmacy for this prescription? Yes If no, delete pharmacy and type the correct one.   Has the prescription been filled recently? No  Is the patient out of the medication? Yes  Has the patient been seen for an appointment in the last year OR does the patient have an upcoming appointment? Yes  Can we respond through MyChart? Yes  Agent: Please be advised that Rx refills may take up to 3 business days. We ask that you follow-up with your pharmacy.

## 2024-10-09 ENCOUNTER — Ambulatory Visit: Admitting: Internal Medicine

## 2024-10-09 ENCOUNTER — Encounter: Payer: Self-pay | Admitting: Internal Medicine

## 2024-10-09 VITALS — BP 128/74 | HR 40 | Ht 72.0 in | Wt 261.0 lb

## 2024-10-09 DIAGNOSIS — Z23 Encounter for immunization: Secondary | ICD-10-CM

## 2024-10-09 DIAGNOSIS — Z0000 Encounter for general adult medical examination without abnormal findings: Secondary | ICD-10-CM | POA: Diagnosis not present

## 2024-10-09 DIAGNOSIS — Z1211 Encounter for screening for malignant neoplasm of colon: Secondary | ICD-10-CM | POA: Diagnosis not present

## 2024-10-09 DIAGNOSIS — G4733 Obstructive sleep apnea (adult) (pediatric): Secondary | ICD-10-CM

## 2024-10-09 DIAGNOSIS — Z131 Encounter for screening for diabetes mellitus: Secondary | ICD-10-CM

## 2024-10-09 DIAGNOSIS — I480 Paroxysmal atrial fibrillation: Secondary | ICD-10-CM | POA: Diagnosis not present

## 2024-10-09 DIAGNOSIS — E785 Hyperlipidemia, unspecified: Secondary | ICD-10-CM

## 2024-10-09 DIAGNOSIS — I10 Essential (primary) hypertension: Secondary | ICD-10-CM

## 2024-10-09 DIAGNOSIS — Z125 Encounter for screening for malignant neoplasm of prostate: Secondary | ICD-10-CM | POA: Diagnosis not present

## 2024-10-09 DIAGNOSIS — E559 Vitamin D deficiency, unspecified: Secondary | ICD-10-CM

## 2024-10-09 NOTE — Assessment & Plan Note (Signed)
 On CPAP nightly. Significant weight loss since surgery. Consider retesting if desired

## 2024-10-09 NOTE — Assessment & Plan Note (Addendum)
 No history of Ablation per patient. Recurrent episode of Afib in May.  Started on Cardiazem in place of amlodipine  and metoprolol  in place of Coreg .  He stopped Eliquis  because he thought he was told to. He is in Afib on exam today; rate controlled.  Recommend resuming Eliquis  and follow up with cardiology.

## 2024-10-09 NOTE — Assessment & Plan Note (Addendum)
 Last level was very low.  He is on a bariatric multi-vitamin. Check vitamin D  and advise if additional supplement is needed.

## 2024-10-09 NOTE — Assessment & Plan Note (Signed)
 Well controlled blood pressure today. Current regimen is Cardiazem, metoprolol  and losartan . No medication side effects noted.

## 2024-10-09 NOTE — Progress Notes (Addendum)
 Date:  10/09/2024   Name:  Bradley Love   DOB:  11/02/1966   MRN:  [id number]   Chief Complaint: No chief complaint on file. Bradley Love is a 58 y.o. male who presents today for his Complete Annual Exam. He feels well. He reports exercising. He reports he is sleeping well.   Health Maintenance  Topic Date Due   Hepatitis B Vaccine (1 of 3 - 19+ 3-dose series) Never done   COVID-19 Vaccine (3 - 2025-26 season) 10/25/2024*   Cologuard (Stool DNA test)  01/18/2025   DTaP/Tdap/Td vaccine (2 - Td or Tdap) 06/03/2033   Pneumococcal Vaccine for age over 42  Completed   Flu Shot  Completed   Hepatitis C Screening  Completed   HIV Screening  Completed   Zoster (Shingles) Vaccine  Completed   HPV Vaccine  Aged Out   Meningitis B Vaccine  Aged Out  *Topic was postponed. The date shown is not the original due date.   Lab Results  Component Value Date   PSA1 0.8 12/02/2021   PSA1 0.8 11/29/2020   PSA1 0.8 08/07/2018   PSA 0.5 11/05/2014    Hypertension This is a chronic problem. The problem is controlled. Pertinent negatives include no chest pain, headaches, palpitations or shortness of breath.  Paroxysmal Afib - on cardiazem and metoprolol .  Coreg  stopped at admission in May.  He was supposed to continue Eliquis  per discharge but he believes that he was told to stop it.  He is not aware of being in Afib vs SR.  He has not seen Cardiology since early 2024. OSA- on CPAP but has lost significant weight since gastric bypass.  He sleeps well and derives benefit.  Review of Systems  Constitutional:  Negative for fatigue and unexpected weight change.  HENT:  Negative for nosebleeds and trouble swallowing.   Eyes:  Negative for visual disturbance.  Respiratory:  Negative for cough, chest tightness, shortness of breath and wheezing.   Cardiovascular:  Negative for chest pain, palpitations and leg swelling.  Gastrointestinal:  Negative for abdominal pain, constipation and  diarrhea.  Genitourinary:  Positive for frequency. Negative for dysuria and hematuria.       Nocturia x 3-4   Musculoskeletal:  Positive for arthralgias (both knees).  Neurological:  Negative for dizziness, weakness, light-headedness and headaches.  Psychiatric/Behavioral:  Negative for dysphoric mood and sleep disturbance. The patient is not nervous/anxious.      Lab Results  Component Value Date   NA 148 (A) 04/24/2024   K 4.1 04/24/2024   CO2 26 (A) 04/24/2024   GLUCOSE 138 (H) 12/02/2021   BUN 30 (A) 04/24/2024   CREATININE 0.3 (A) 04/24/2024   CALCIUM 9.1 04/24/2024   EGFR 62 04/24/2024   GFRNONAA 59 (L) 11/29/2020   Lab Results  Component Value Date   CHOL 166 05/29/2022   HDL 43 05/29/2022   LDLCALC 101 05/29/2022   TRIG 110 05/29/2022   CHOLHDL 4.7 12/02/2021   Lab Results  Component Value Date   TSH 3.24 04/24/2024   Lab Results  Component Value Date   HGBA1C 5.1 05/29/2022   Lab Results  Component Value Date   WBC 9.5 04/24/2024   HGB 13.9 04/24/2024   HCT 39 (A) 04/24/2024   MCV 68 (L) 12/02/2021   PLT 115 (A) 04/24/2024   Lab Results  Component Value Date   ALT 24 05/29/2022   AST 12 (A) 05/29/2022   ALKPHOS 64 05/29/2022  BILITOT 1.4 (H) 12/02/2021   Lab Results  Component Value Date   VD25OH 17.5 05/29/2022     Patient Active Problem List   Diagnosis Date Noted   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   Bilateral primary osteoarthritis of knee 11/30/2023   Vitamin D  deficiency 06/12/2022   Acquired thrombophilia 06/12/2022   Paroxysmal atrial fibrillation (HCC) 12/02/2021   Mild hyperlipidemia 12/02/2021   CSF leak 03/17/2021   Obstructive sleep apnea syndrome 02/07/2019   Family history of sickle cell trait 10/31/2017   Essential (primary) hypertension 07/27/2015    No Known Allergies  Past Surgical History:  Procedure Laterality Date   CHOLECYSTECTOMY  12/18/2000   CORNEAL TRANSPLANT Right 12/18/2008   EYE SURGERY     LAPAROSCOPIC  GASTRIC SLEEVE RESECTION  01/03/2023   SINUS EXPLORATION Bilateral 03/2021   for CSF leak    Social History   Tobacco Use   Smoking status: Former    Current packs/day: 0.00    Types: Cigarettes    Quit date: 1997    Years since quitting: 28.8   Smokeless tobacco: Never  Vaping Use   Vaping status: Never Used  Substance Use Topics   Alcohol use: Yes    Alcohol/week: 1.0 standard drink of alcohol    Types: 1 Shots of liquor per week    Comment: occasionally -3-4x/yr   Drug use: No     Medication list has been reviewed and updated.  Current Meds  Medication Sig   albuterol  (VENTOLIN  HFA) 108 (90 Base) MCG/ACT inhaler Inhale 2 puffs into the lungs 4 (four) times daily as needed.   apixaban  (ELIQUIS ) 5 MG TABS tablet Take 1 tablet (5 mg total) by mouth 2 (two) times daily.   diclofenac Sodium (VOLTAREN) 1 % GEL APPLY 2 GRAMS TO THE AFFECTED AREA(S) BY TOPICAL ROUTE 4 TIMES PER DAY   diltiazem  (CARDIZEM  CD) 120 MG 24 hr capsule Take 1 capsule (120 mg total) by mouth daily.   losartan  (COZAAR ) 100 MG tablet Take 1 tablet (100 mg total) by mouth daily.   metoprolol  succinate (TOPROL -XL) 100 MG 24 hr tablet Take 2 tablets (200 mg total) by mouth daily.   Multiple Vitamins-Minerals (MULTIVITAL PO) Take by mouth daily.   Omega 3 1200 MG CAPS Take 1 capsule by mouth daily.   sildenafil  (VIAGRA ) 50 MG tablet Take 0.5 tablets (25 mg total) by mouth daily as needed for erectile dysfunction.   UNABLE TO FIND Med Name: CPAP @@ 4 cm H2O       06/18/2024   10:27 AM 04/29/2024    1:23 PM 07/17/2023    9:51 AM 03/09/2023    1:33 PM  GAD 7 : Generalized Anxiety Score  Nervous, Anxious, on Edge 0 0 0 0  Control/stop worrying 0 0 0 0  Worry too much - different things 0 0 0 0  Trouble relaxing 0 0 0 0  Restless 0 0 0 0  Easily annoyed or irritable 0 0 0 0  Afraid - awful might happen 0 0 0 0  Total GAD 7 Score 0 0 0 0  Anxiety Difficulty Not difficult at all Not difficult at all Not  difficult at all Not difficult at all       06/18/2024   10:27 AM 04/29/2024    1:23 PM 07/17/2023    9:51 AM  Depression screen PHQ 2/9  Decreased Interest 0 0 0  Down, Depressed, Hopeless 0 0 0  PHQ - 2 Score 0 0 0  Altered sleeping 0 0 0  Tired, decreased energy 0 0 0  Change in appetite 0 1 1  Feeling bad or failure about yourself  0 0 0  Trouble concentrating 0 0 0  Moving slowly or fidgety/restless 0 0 0  Suicidal thoughts 0 0 0  PHQ-9 Score 0 1 1  Difficult doing work/chores Not difficult at all Not difficult at all Not difficult at all    BP Readings from Last 3 Encounters:  10/09/24 128/74  06/18/24 125/78  04/29/24 124/78    Physical Exam Vitals and nursing note reviewed.  Constitutional:      Appearance: Normal appearance. He is well-developed.  HENT:     Head: Normocephalic.     Right Ear: Tympanic membrane, ear canal and external ear normal.     Left Ear: Tympanic membrane, ear canal and external ear normal.     Nose: Nose normal.  Eyes:     Conjunctiva/sclera: Conjunctivae normal.     Pupils: Pupils are equal, round, and reactive to light.  Neck:     Thyroid: No thyromegaly.     Vascular: No carotid bruit.  Cardiovascular:     Rate and Rhythm: Normal rate. Rhythm irregular.     Heart sounds: Normal heart sounds.  Pulmonary:     Effort: Pulmonary effort is normal.     Breath sounds: Normal breath sounds. No wheezing.  Chest:  Breasts:    Right: No mass.     Left: No mass.  Abdominal:     General: Bowel sounds are normal.     Palpations: Abdomen is soft.     Tenderness: There is no abdominal tenderness.  Musculoskeletal:        General: Normal range of motion.     Cervical back: Normal range of motion and neck supple.  Lymphadenopathy:     Cervical: No cervical adenopathy.  Skin:    General: Skin is warm and dry.     Capillary Refill: Capillary refill takes less than 2 seconds.  Neurological:     General: No focal deficit present.     Mental  Status: He is alert and oriented to person, place, and time.     Deep Tendon Reflexes: Reflexes are normal and symmetric.  Psychiatric:        Attention and Perception: Attention normal.        Mood and Affect: Mood normal.        Thought Content: Thought content normal.     Wt Readings from Last 3 Encounters:  10/09/24 261 lb (118.4 kg)  06/18/24 262 lb (118.8 kg)  04/29/24 264 lb 4 oz (119.9 kg)    BP 128/74   Pulse (!) 40   Ht 6' (1.829 m)   Wt 261 lb (118.4 kg)   SpO2 95%   BMI 35.40 kg/m   Assessment and Plan:  Problem List Items Addressed This Visit       Unprioritized   Essential (primary) hypertension (Chronic)   Well controlled blood pressure today. Current regimen is Cardiazem, metoprolol  and losartan . No medication side effects noted.        Relevant Orders   CBC with Differential/Platelet   Comprehensive metabolic panel with GFR   Urinalysis, Routine w reflex microscopic   Obstructive sleep apnea syndrome (Chronic)   On CPAP nightly. Significant weight loss since surgery. Consider retesting if desired      Paroxysmal atrial fibrillation (HCC) (Chronic)   No history of Ablation per patient. Recurrent episode of  Afib in May.  Started on Cardiazem in place of amlodipine  and metoprolol  in place of Coreg .  He stopped Eliquis  because he thought he was told to. He is in Afib on exam today; rate controlled.  Recommend resuming Eliquis  and follow up with cardiology.       Mild hyperlipidemia (Chronic)   Managed with diet only. Lab Results  Component Value Date   LDLCALC 101 05/29/2022         Relevant Orders   Lipid panel   Vitamin D  deficiency (Chronic)   Last level was very low.  He is on a bariatric multi-vitamin. Check vitamin D  and advise if additional supplement is needed.      Relevant Orders   VITAMIN D  25 Hydroxy (Vit-D Deficiency, Fractures)   Other Visit Diagnoses       Annual physical exam    -  Primary   Relevant Orders    CBC with Differential/Platelet   Comprehensive metabolic panel with GFR   Hemoglobin A1c   Lipid panel   PSA   VITAMIN D  25 Hydroxy (Vit-D Deficiency, Fractures)   Urinalysis, Routine w reflex microscopic     Colon cancer screening       Cologuard 2023 has GI appt next month to discuss colonoscopy     Prostate cancer screening       Relevant Orders   PSA     Screening for diabetes mellitus       Relevant Orders   Hemoglobin A1c     Encounter for immunization       Relevant Orders   Flu vaccine trivalent PF, 6mos and older(Flulaval,Afluria,Fluarix,Fluzone) (Completed)   Pneumococcal conjugate vaccine 20-valent (Completed)       Return in about 6 months (around 04/09/2025) for TOC HTN, Afib - Dr. Sol.    Leita HILARIO Adie, MD [facility] [place]

## 2024-10-09 NOTE — Assessment & Plan Note (Signed)
 Managed with diet only. Lab Results  Component Value Date   LDLCALC 101 05/29/2022

## 2024-10-10 ENCOUNTER — Ambulatory Visit: Payer: Self-pay | Admitting: Internal Medicine

## 2024-10-10 LAB — LIPID PANEL
Chol/HDL Ratio: 2.1 ratio (ref 0.0–5.0)
Cholesterol, Total: 122 mg/dL (ref 100–199)
HDL: 57 mg/dL (ref >39)
LDL Chol Calc (NIH): 52 mg/dL (ref 0–99)
Triglycerides: 60 mg/dL (ref 0–149)
VLDL Cholesterol Cal: 13 mg/dL (ref 5–40)

## 2024-10-10 LAB — COMPREHENSIVE METABOLIC PANEL WITH GFR
ALT: 55 IU/L — ABNORMAL HIGH (ref 0–44)
AST: 34 IU/L (ref 0–40)
Albumin: 4.6 g/dL (ref 3.8–4.9)
Alkaline Phosphatase: 80 IU/L (ref 47–123)
BUN/Creatinine Ratio: 15 (ref 9–20)
BUN: 24 mg/dL (ref 6–24)
Bilirubin Total: 1.5 mg/dL — ABNORMAL HIGH (ref 0.0–1.2)
CO2: 22 mmol/L (ref 20–29)
Calcium: 8.9 mg/dL (ref 8.7–10.2)
Chloride: 108 mmol/L — ABNORMAL HIGH (ref 96–106)
Creatinine, Ser: 1.6 mg/dL — ABNORMAL HIGH (ref 0.76–1.27)
Globulin, Total: 1.6 g/dL (ref 1.5–4.5)
Glucose: 93 mg/dL (ref 70–99)
Potassium: 4.6 mmol/L (ref 3.5–5.2)
Sodium: 144 mmol/L (ref 134–144)
Total Protein: 6.2 g/dL (ref 6.0–8.5)
eGFR: 49 mL/min/1.73 — ABNORMAL LOW (ref >59)

## 2024-10-10 LAB — CBC WITH DIFFERENTIAL/PLATELET
Basophils Absolute: 0 x10E3/uL (ref 0.0–0.2)
Basos: 0 %
EOS (ABSOLUTE): 0.1 x10E3/uL (ref 0.0–0.4)
Eos: 1 %
Hematocrit: 43.2 % (ref 37.5–51.0)
Hemoglobin: 13.8 g/dL (ref 13.0–17.7)
Immature Grans (Abs): 0 x10E3/uL (ref 0.0–0.1)
Immature Granulocytes: 0 %
Lymphocytes Absolute: 2.1 x10E3/uL (ref 0.7–3.1)
Lymphs: 22 %
MCH: 23.2 pg — ABNORMAL LOW (ref 26.6–33.0)
MCHC: 31.9 g/dL (ref 31.5–35.7)
MCV: 73 fL — ABNORMAL LOW (ref 79–97)
Monocytes Absolute: 0.3 x10E3/uL (ref 0.1–0.9)
Monocytes: 3 %
Neutrophils Absolute: 6.8 x10E3/uL (ref 1.4–7.0)
Neutrophils: 74 %
Platelets: 116 x10E3/uL — ABNORMAL LOW (ref 150–450)
RBC: 5.94 x10E6/uL — ABNORMAL HIGH (ref 4.14–5.80)
RDW: 19 % — ABNORMAL HIGH (ref 11.6–15.4)
WBC: 9.3 x10E3/uL (ref 3.4–10.8)

## 2024-10-10 LAB — VITAMIN D 25 HYDROXY (VIT D DEFICIENCY, FRACTURES): Vit D, 25-Hydroxy: 22.3 ng/mL — ABNORMAL LOW (ref 30.0–100.0)

## 2024-10-10 LAB — URINALYSIS, ROUTINE W REFLEX MICROSCOPIC
Glucose, UA: NEGATIVE
Nitrite, UA: NEGATIVE
RBC, UA: NEGATIVE
Specific Gravity, UA: 1.028 (ref 1.005–1.030)
Urobilinogen, Ur: 1 mg/dL (ref 0.2–1.0)
pH, UA: 5.5 (ref 5.0–7.5)

## 2024-10-10 LAB — HEMOGLOBIN A1C
Est. average glucose Bld gHb Est-mCnc: 88 mg/dL
Hgb A1c MFr Bld: 4.7 % — ABNORMAL LOW (ref 4.8–5.6)

## 2024-10-10 LAB — MICROSCOPIC EXAMINATION: Casts: NONE SEEN /LPF

## 2024-10-10 LAB — PSA: Prostate Specific Ag, Serum: 0.8 ng/mL (ref 0.0–4.0)

## 2024-10-13 ENCOUNTER — Telehealth: Payer: Self-pay

## 2024-10-13 NOTE — Telephone Encounter (Signed)
 Copied from CRM [id number]. Topic: Clinical - Lab/Test Results >> Oct 10, 2024  1:29 PM Montie POUR wrote: Reason for CRM:  Cosmo is calling for his lab results. I let him know that his doctor needs to review. Please call him when doctor reviews at [phone] to go over the lab results. Thanks

## 2024-10-30 ENCOUNTER — Ambulatory Visit: Payer: Self-pay

## 2024-10-30 NOTE — Telephone Encounter (Signed)
 FYI Only or Action Required?: FYI only for provider: appointment scheduled on 10/31/2024.  Patient was last seen in primary care on 10/09/2024 by Justus Leita DEL, MD.  Called Nurse Triage reporting Cough.  Symptoms began several days ago.  Interventions attempted: Nothing.  Symptoms are: gradually worsening.  Triage Disposition: Home Care  Patient/caregiver understands and will follow disposition?: No, wishes to speak with PCP  Copied from CRM #[id number]. Topic: Clinical - Medication Question >> Oct 30, 2024  2:33 PM Bradley Love wrote: Reason for CRM:  Bradley Love is requesting an antibiotic and cough medications that he gets every year for sinus pressure, coughing, drainage. Please call or send a message through MyChart if this can be done. Call back number [phone] Reason for Disposition  Cough  Answer Assessment - Initial Assessment Questions 1. ONSET: When did the cough begin?      Three days 2. SEVERITY: How bad is the cough today?      Worse at night 3. SPUTUM: Describe the color of your sputum (e.g., none, dry cough; clear, white, yellow, green)     yellow 4. HEMOPTYSIS: Are you coughing up any blood? If Yes, ask: How much? (e.g., flecks, streaks, tablespoons, etc.)     denies 5. DIFFICULTY BREATHING: Are you having difficulty breathing? If Yes, ask: How bad is it? (e.g., mild, moderate, severe)      denies 6. FEVER: Do you have a fever? If Yes, ask: What is your temperature, how was it measured, and when did it start?     denies 7. CARDIAC HISTORY: Do you have any history of heart disease? (e.g., heart attack, congestive heart failure)      HTN  8. LUNG HISTORY: Do you have any history of lung disease?  (e.g., pulmonary embolus, asthma, emphysema)     Sleep apnea 9. PE RISK FACTORS: Do you have a history of blood clots? (or: recent major surgery, recent prolonged travel, bedridden)     N/a 10. OTHER SYMPTOMS: Do you have any other  symptoms? (e.g., runny nose, wheezing, chest pain)       Sinus pain and pressure  12. TRAVEL: Have you traveled out of the country in the last month? (e.g., travel history, exposures)       denies  Protocols used: Cough - Acute Productive-A-AH

## 2024-10-30 NOTE — Telephone Encounter (Signed)
 RN attempted contact x 1 to discuss symptoms, LVM to return call; will attempt contact at a later time.          Copied from CRM [id number]. Topic: Clinical - Medication Question >> Oct 30, 2024  2:33 PM Bradley Love wrote: Reason for CRM:  Bradley Love is requesting an antibiotic and cough medications that he gets every year for sinus pressure, coughing, drainage. Please call or send a message through MyChart if this can be done. Call back number [phone]

## 2024-10-31 ENCOUNTER — Ambulatory Visit: Admitting: Internal Medicine

## 2024-10-31 ENCOUNTER — Encounter: Payer: Self-pay | Admitting: Internal Medicine

## 2024-10-31 VITALS — BP 124/78 | HR 84 | Temp 97.6°F | Ht 73.0 in | Wt 264.0 lb

## 2024-10-31 DIAGNOSIS — J0100 Acute maxillary sinusitis, unspecified: Secondary | ICD-10-CM

## 2024-10-31 DIAGNOSIS — R051 Acute cough: Secondary | ICD-10-CM | POA: Diagnosis not present

## 2024-10-31 MED ORDER — PROMETHAZINE-DM 6.25-15 MG/5ML PO SYRP: 5.0000 mL | ORAL_SOLUTION | Freq: Four times a day (QID) | ORAL | 0 refills | Status: AC | PRN

## 2024-10-31 MED ORDER — AZITHROMYCIN 250 MG PO TABS: ORAL_TABLET | ORAL | 0 refills | Status: AC

## 2024-10-31 MED ORDER — ALBUTEROL SULFATE HFA 108 (90 BASE) MCG/ACT IN AERS: 2.0000 | INHALATION_SPRAY | Freq: Four times a day (QID) | RESPIRATORY_TRACT | 2 refills | Status: AC | PRN

## 2024-10-31 NOTE — Progress Notes (Signed)
 Date:  10/31/2024   Name:  Bradley Love   DOB:  Nov 10, 1966   MRN:  [id number]   Chief Complaint: Sinusitis (X 3 days- Sinus pressure and pain. Cough with yellow production. Cough is worse at night with difficulty sleeping because of it. No SOB or fever. )  Sinusitis This is a new problem. Episode onset: 3 days. There has been no fever. Associated symptoms include congestion, coughing, headaches and sinus pressure. Pertinent negatives include no chills, ear pain or shortness of breath. Treatments tried: Coricidin HBP.    Review of Systems  Constitutional:  Negative for chills, fatigue and fever.  HENT:  Positive for congestion, postnasal drip and sinus pressure. Negative for ear pain and sinus pain.   Respiratory:  Positive for cough and wheezing. Negative for chest tightness and shortness of breath.   Cardiovascular:  Negative for chest pain and palpitations.  Neurological:  Positive for headaches.  Psychiatric/Behavioral:  Positive for sleep disturbance. Negative for dysphoric mood. The patient is not nervous/anxious.      Lab Results  Component Value Date   NA 144 10/09/2024   K 4.6 10/09/2024   CO2 22 10/09/2024   GLUCOSE 93 10/09/2024   BUN 24 10/09/2024   CREATININE 1.60 (H) 10/09/2024   CALCIUM 8.9 10/09/2024   EGFR 49 (L) 10/09/2024   GFRNONAA 59 (L) 11/29/2020   Lab Results  Component Value Date   CHOL 122 10/09/2024   HDL 57 10/09/2024   LDLCALC 52 10/09/2024   TRIG 60 10/09/2024   CHOLHDL 2.1 10/09/2024   Lab Results  Component Value Date   TSH 3.24 04/24/2024   Lab Results  Component Value Date   HGBA1C 4.7 (L) 10/09/2024   Lab Results  Component Value Date   WBC 9.3 10/09/2024   HGB 13.8 10/09/2024   HCT 43.2 10/09/2024   MCV 73 (L) 10/09/2024   PLT 116 (L) 10/09/2024   Lab Results  Component Value Date   ALT 55 (H) 10/09/2024   AST 34 10/09/2024   ALKPHOS 80 10/09/2024   BILITOT 1.5 (H) 10/09/2024   Lab Results  Component Value  Date   VD25OH 22.3 (L) 10/09/2024     Patient Active Problem List   Diagnosis Date Noted   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   Bilateral primary osteoarthritis of knee 11/30/2023   Vitamin D  deficiency 06/12/2022   Acquired thrombophilia 06/12/2022   Paroxysmal atrial fibrillation (HCC) 12/02/2021   Mild hyperlipidemia 12/02/2021   CSF leak 03/17/2021   Obstructive sleep apnea syndrome 02/07/2019   Family history of sickle cell trait 10/31/2017   Essential (primary) hypertension 07/27/2015    No Known Allergies  Past Surgical History:  Procedure Laterality Date   CHOLECYSTECTOMY  12/18/2000   CORNEAL TRANSPLANT Right 12/18/2008   EYE SURGERY     LAPAROSCOPIC GASTRIC SLEEVE RESECTION  01/03/2023   SINUS EXPLORATION Bilateral 03/2021   for CSF leak    Social History   Tobacco Use   Smoking status: Former    Current packs/day: 0.00    Types: Cigarettes    Quit date: 1997    Years since quitting: 28.8   Smokeless tobacco: Never  Vaping Use   Vaping status: Never Used  Substance Use Topics   Alcohol use: Yes    Alcohol/week: 1.0 standard drink of alcohol    Types: 1 Shots of liquor per week    Comment: occasionally -3-4x/yr   Drug use: No     Medication list has  been reviewed and updated.  Current Meds  Medication Sig   apixaban  (ELIQUIS ) 5 MG TABS tablet Take 1 tablet (5 mg total) by mouth 2 (two) times daily.   azithromycin  (ZITHROMAX  Z-PAK) 250 MG tablet UAD   diclofenac Sodium (VOLTAREN) 1 % GEL APPLY 2 GRAMS TO THE AFFECTED AREA(S) BY TOPICAL ROUTE 4 TIMES PER DAY   diltiazem  (CARDIZEM  CD) 120 MG 24 hr capsule Take 1 capsule (120 mg total) by mouth daily.   losartan  (COZAAR ) 100 MG tablet Take 1 tablet (100 mg total) by mouth daily.   metoprolol  succinate (TOPROL -XL) 100 MG 24 hr tablet Take 2 tablets (200 mg total) by mouth daily.   Multiple Vitamins-Minerals (MULTIVITAL PO) Take by mouth daily.   Omega 3 1200 MG CAPS Take 1 capsule by mouth daily.    promethazine -dextromethorphan (PROMETHAZINE -DM) 6.25-15 MG/5ML syrup Take 5 mLs by mouth 4 (four) times daily as needed for up to 9 days for cough.   sildenafil  (VIAGRA ) 50 MG tablet Take 0.5 tablets (25 mg total) by mouth daily as needed for erectile dysfunction.   UNABLE TO FIND Med Name: CPAP @@ 4 cm H2O   [DISCONTINUED] albuterol  (VENTOLIN  HFA) 108 (90 Base) MCG/ACT inhaler Inhale 2 puffs into the lungs 4 (four) times daily as needed.       10/31/2024    9:52 AM 06/18/2024   10:27 AM 04/29/2024    1:23 PM 07/17/2023    9:51 AM  GAD 7 : Generalized Anxiety Score  Nervous, Anxious, on Edge 0 0 0 0  Control/stop worrying 0 0 0 0  Worry too much - different things 0 0 0 0  Trouble relaxing 0 0 0 0  Restless 0 0 0 0  Easily annoyed or irritable 0 0 0 0  Afraid - awful might happen 0 0 0 0  Total GAD 7 Score 0 0 0 0  Anxiety Difficulty Not difficult at all Not difficult at all Not difficult at all Not difficult at all       10/31/2024    9:52 AM 06/18/2024   10:27 AM 04/29/2024    1:23 PM  Depression screen PHQ 2/9  Decreased Interest 0 0 0  Down, Depressed, Hopeless 0 0 0  PHQ - 2 Score 0 0 0  Altered sleeping 0 0 0  Tired, decreased energy 0 0 0  Change in appetite 0 0 1  Feeling bad or failure about yourself  0 0 0  Trouble concentrating 0 0 0  Moving slowly or fidgety/restless 0 0 0  Suicidal thoughts 0 0 0  PHQ-9 Score 0 0  1   Difficult doing work/chores Not difficult at all Not difficult at all Not difficult at all     Data saved with a previous flowsheet row definition    BP Readings from Last 3 Encounters:  10/31/24 124/78  10/09/24 128/74  06/18/24 125/78    Physical Exam Constitutional:      Appearance: Normal appearance. He is well-developed.  HENT:     Right Ear: Ear canal and external ear normal. Tympanic membrane is not erythematous or retracted.     Left Ear: Ear canal and external ear normal. Tympanic membrane is erythematous and retracted.     Nose:      Right Sinus: Maxillary sinus tenderness present. No frontal sinus tenderness.     Left Sinus: Maxillary sinus tenderness present. No frontal sinus tenderness.     Mouth/Throat:     Mouth: No oral lesions.  Pharynx: Oropharynx is clear. Uvula midline. No oropharyngeal exudate or posterior oropharyngeal erythema.  Cardiovascular:     Rate and Rhythm: Normal rate and regular rhythm.     Heart sounds: Normal heart sounds.  Pulmonary:     Breath sounds: Normal breath sounds. No decreased breath sounds, wheezing or rales.  Lymphadenopathy:     Cervical: No cervical adenopathy.  Neurological:     Mental Status: He is alert and oriented to person, place, and time.     Wt Readings from Last 3 Encounters:  10/31/24 264 lb (119.7 kg)  10/09/24 261 lb (118.4 kg)  06/18/24 262 lb (118.8 kg)    BP 124/78   Pulse 84   Temp 97.6 F (36.4 C) (Oral)   Ht 6' 1 (1.854 m)   Wt 264 lb (119.7 kg)   SpO2 94%   BMI 34.83 kg/m   Assessment and Plan:  Problem List Items Addressed This Visit   None Visit Diagnoses       Acute non-recurrent maxillary sinusitis    -  Primary   continue Coricidin HBP, push fluids follow up if needed   Relevant Medications   azithromycin  (ZITHROMAX  Z-PAK) 250 MG tablet   promethazine -dextromethorphan (PROMETHAZINE -DM) 6.25-15 MG/5ML syrup     Acute cough       Relevant Medications   albuterol  (VENTOLIN  HFA) 108 (90 Base) MCG/ACT inhaler       No follow-ups on file.    Leita HILARIO Adie, MD [facility] [place]

## 2024-11-06 ENCOUNTER — Telehealth: Payer: Self-pay

## 2024-11-06 ENCOUNTER — Telehealth: Payer: Self-pay | Admitting: Internal Medicine

## 2024-11-06 NOTE — Telephone Encounter (Signed)
 Copied from CRM [id number]. Topic: Referral - Request for Referral >> Nov 06, 2024  8:46 AM Berwyn MATSU wrote: Did the patient discuss referral with their provider in the last year? Yes (If No - schedule appointment) (If Yes - send message)  Appointment offered? No  Type of order/referral and detailed reason for visit: urology referral   Preference of office, provider, location: [facility] [address] [place],  [place]  [address]  [phone] If referral order, have you been seen by this specialty before? No (If Yes, this issue or another issue? When? Where?  Can we respond through MyChart? Yes

## 2024-11-06 NOTE — Telephone Encounter (Signed)
 Copied from CRM #[id number]. Topic: General - Call Back - No Documentation >> Nov 06, 2024  2:11 PM Yolanda T wrote: Reason for CRM: patient called stated he was just called but when he answered the phone no one said anything. Advised no documentation but would leave a message for a call back

## 2024-11-06 NOTE — Telephone Encounter (Signed)
 Called patient and left VM asking him to call back to let us  know why he needs referral. Waiting for return call. If patient calls back, pls send CRM with info for referral.   CMcAdoo

## 2024-11-06 NOTE — Telephone Encounter (Signed)
 Copied from CRM [id number]. Topic: Referral - Status >> Nov 06, 2024  9:46 AM Jasmin G wrote: Reason for CRM: Pt states that he was recently told by the urology clinic that he was referred to that they need a reason as to why he needs the referral, which is symptoms retaining to a large prostate.

## 2024-11-07 NOTE — Telephone Encounter (Signed)
 Viewed chart I do not see who called the patient.  KP

## 2024-11-10 ENCOUNTER — Other Ambulatory Visit: Payer: Self-pay | Admitting: Internal Medicine

## 2024-11-10 DIAGNOSIS — I10 Essential (primary) hypertension: Secondary | ICD-10-CM

## 2024-11-10 DIAGNOSIS — I480 Paroxysmal atrial fibrillation: Secondary | ICD-10-CM

## 2024-11-10 NOTE — Telephone Encounter (Unsigned)
 Copied from CRM #[id number]. Topic: Clinical - Medication Refill >> Nov 10, 2024  2:46 PM Rosaria E wrote: Medication: metoprolol  succinate (TOPROL -XL) 100 MG 24 hr tablet   Has the patient contacted their pharmacy? Yes (Agent: If no, request that the patient contact the pharmacy for the refill. If patient does not wish to contact the pharmacy document the reason why and proceed with request.) (Agent: If yes, when and what did the pharmacy advise?)  This is the patient's preferred pharmacy:  [facility] [address]), [place] - [address] [place]) [place] [address] Phone: [phone] Fax: [phone]  Is this the correct pharmacy for this prescription? Yes If no, delete pharmacy and type the correct one.   Has the prescription been filled recently? Yes  Is the patient out of the medication? Yes  Has the patient been seen for an appointment in the last year OR does the patient have an upcoming appointment? Yes  Can we respond through MyChart? Yes  Agent: Please be advised that Rx refills may take up to 3 business days. We ask that you follow-up with your pharmacy.

## 2024-11-11 ENCOUNTER — Other Ambulatory Visit: Payer: Self-pay

## 2024-11-11 NOTE — Telephone Encounter (Signed)
 Requested medication (s) are due for refill today: expired medication  Requested medication (s) are on the active medication list: yes   Last refill:  06/18/24-10/31/24 #180 1 refills  Future visit scheduled: yes 04/09/25  Notes to clinic:  expired medication . Do you want to renew Rx?     Requested Prescriptions  Pending Prescriptions Disp Refills   metoprolol  succinate (TOPROL -XL) 100 MG 24 hr tablet [Pharmacy Med Name: Metoprolol  Succinate ER 100 MG Oral Tablet Extended Release 24 Hour] 180 tablet 0    Sig: Take 2 tablets by mouth once daily     Cardiovascular:  Beta Blockers Passed - 11/11/2024  3:14 PM      Passed - Last BP in normal range    BP Readings from Last 1 Encounters:  10/31/24 124/78         Passed - Last Heart Rate in normal range    Pulse Readings from Last 1 Encounters:  10/31/24 84         Passed - Valid encounter within last 6 months    Recent Outpatient Visits           1 week ago Acute non-recurrent maxillary sinusitis   [place] [facility], Leita DEL, MD   1 month ago Annual physical exam   [facility] at [facility], Leita DEL, MD   4 months ago Essential (primary) hypertension   [place] [facility] at [facility], Leita DEL, MD   6 months ago Paroxysmal atrial fibrillation [facility])   [place] [facility] at [facility], Leita DEL, MD   9 months ago Viral upper respiratory tract infection   [facility] at [facility], Leita DEL, MD

## 2024-11-11 NOTE — Telephone Encounter (Signed)
 Requested medication (s) are due for refill today: expired medication  Requested medication (s) are on the active medication list: yes   Last refill:  06/18/24- 10/31/24 #180 1 refills  Future visit scheduled: yes 04/09/25  Notes to clinic:  expired medication . Do you want to renew Rx? Duplicate request     Requested Prescriptions  Pending Prescriptions Disp Refills   metoprolol  succinate (TOPROL -XL) 100 MG 24 hr tablet [Pharmacy Med Name: Metoprolol  Succinate ER 100 MG Oral Tablet Extended Release 24 Hour] 180 tablet 0    Sig: Take 2 tablets by mouth once daily     Cardiovascular:  Beta Blockers Passed - 11/11/2024  3:18 PM      Passed - Last BP in normal range    BP Readings from Last 1 Encounters:  10/31/24 124/78         Passed - Last Heart Rate in normal range    Pulse Readings from Last 1 Encounters:  10/31/24 84         Passed - Valid encounter within last 6 months    Recent Outpatient Visits           1 week ago Acute non-recurrent maxillary sinusitis   [place] [facility] at [facility], Leita DEL, MD   1 month ago Annual physical exam   [facility] at [facility], Leita DEL, MD   4 months ago Essential (primary) hypertension   [place] [facility] at [facility], Leita DEL, MD   6 months ago Paroxysmal atrial fibrillation [facility])   [place] [facility] at [facility], Leita DEL, MD   9 months ago Viral upper respiratory tract infection   [facility] at [facility], Leita DEL, MD

## 2024-11-18 ENCOUNTER — Telehealth: Payer: Self-pay

## 2024-11-18 NOTE — Telephone Encounter (Signed)
 Copied from CRM [id number]. Topic: General - Other >> Nov 18, 2024 12:21 PM Lonell PEDLAR wrote: Reason for CRM: Melissa from [facility], GI, states they faxed over clearance request for eloquis on 11/05/24. Pt has colonoscopy on 12/10. Please advise.   C/b: [phone]

## 2024-11-20 NOTE — Telephone Encounter (Signed)
 Faxed back today

## 2024-11-26 ENCOUNTER — Other Ambulatory Visit: Payer: Self-pay | Admitting: Internal Medicine

## 2024-11-26 ENCOUNTER — Ambulatory Visit: Admit: 2024-11-26 | Admitting: Gastroenterology

## 2024-11-26 ENCOUNTER — Telehealth: Payer: Self-pay

## 2024-11-26 DIAGNOSIS — R351 Nocturia: Secondary | ICD-10-CM | POA: Insufficient documentation

## 2024-11-26 SURGERY — COLONOSCOPY
Anesthesia: General

## 2024-11-26 NOTE — Progress Notes (Unsigned)
 Date:  11/26/2024   Name:  Bradley Love   DOB:  [birth year]   MRN:  [id number]   Chief Complaint: No chief complaint on file.  HPI  Review of Systems   Lab Results  Component Value Date   NA 144 10/09/2024   K 4.6 10/09/2024   CO2 22 10/09/2024   GLUCOSE 93 10/09/2024   BUN 24 10/09/2024   CREATININE 1.60 (H) 10/09/2024   CALCIUM 8.9 10/09/2024   EGFR 49 (L) 10/09/2024   GFRNONAA 59 (L) 11/29/2020   Lab Results  Component Value Date   CHOL 122 10/09/2024   HDL 57 10/09/2024   LDLCALC 52 10/09/2024   TRIG 60 10/09/2024   CHOLHDL 2.1 10/09/2024   Lab Results  Component Value Date   TSH 3.24 04/24/2024   Lab Results  Component Value Date   HGBA1C 4.7 (L) 10/09/2024   Lab Results  Component Value Date   WBC 9.3 10/09/2024   HGB 13.8 10/09/2024   HCT 43.2 10/09/2024   MCV 73 (L) 10/09/2024   PLT 116 (L) 10/09/2024   Lab Results  Component Value Date   ALT 55 (H) 10/09/2024   AST 34 10/09/2024   ALKPHOS 80 10/09/2024   BILITOT 1.5 (H) 10/09/2024   Lab Results  Component Value Date   VD25OH 22.3 (L) 10/09/2024     Patient Active Problem List   Diagnosis Date Noted   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   Bilateral primary osteoarthritis of knee 11/30/2023   Vitamin D  deficiency 06/12/2022   Acquired thrombophilia 06/12/2022   Paroxysmal atrial fibrillation (HCC) 12/02/2021   Mild hyperlipidemia 12/02/2021   CSF leak 03/17/2021   Obstructive sleep apnea syndrome 02/07/2019   Family history of sickle cell trait 10/31/2017   Essential (primary) hypertension 07/27/2015    No Known Allergies  Past Surgical History:  Procedure Laterality Date   CHOLECYSTECTOMY  12/18/2000   CORNEAL TRANSPLANT Right 12/18/2008   EYE SURGERY     LAPAROSCOPIC GASTRIC SLEEVE RESECTION  01/03/2023   SINUS EXPLORATION Bilateral 03/2021   for CSF leak    Social History   Tobacco Use   Smoking status: Former    Current packs/day: 0.00    Types: Cigarettes     Quit date: 1997    Years since quitting: 28.9   Smokeless tobacco: Never  Vaping Use   Vaping status: Never Used  Substance Use Topics   Alcohol use: Yes    Alcohol/week: 1.0 standard drink of alcohol    Types: 1 Shots of liquor per week    Comment: occasionally -3-4x/yr   Drug use: No     Medication list has been reviewed and updated.  No outpatient medications have been marked as taking for the 11/26/24 encounter (Orders Only) with Justus Leita DEL, MD.       10/31/2024    9:52 AM 06/18/2024   10:27 AM 04/29/2024    1:23 PM 07/17/2023    9:51 AM  GAD 7 : Generalized Anxiety Score  Nervous, Anxious, on Edge 0 0 0 0  Control/stop worrying 0 0 0 0  Worry too much - different things 0 0 0 0  Trouble relaxing 0 0 0 0  Restless 0 0 0 0  Easily annoyed or irritable 0 0 0 0  Afraid - awful might happen 0 0 0 0  Total GAD 7 Score 0 0 0 0  Anxiety Difficulty Not difficult at all Not difficult at all Not difficult at  all Not difficult at all       10/31/2024    9:52 AM 06/18/2024   10:27 AM 04/29/2024    1:23 PM  Depression screen PHQ 2/9  Decreased Interest 0 0 0  Down, Depressed, Hopeless 0 0 0  PHQ - 2 Score 0 0 0  Altered sleeping 0 0 0  Tired, decreased energy 0 0 0  Change in appetite 0 0 1  Feeling bad or failure about yourself  0 0 0  Trouble concentrating 0 0 0  Moving slowly or fidgety/restless 0 0 0  Suicidal thoughts 0 0 0  PHQ-9 Score 0 0  1   Difficult doing work/chores Not difficult at all Not difficult at all Not difficult at all     Data saved with a previous flowsheet row definition    BP Readings from Last 3 Encounters:  10/31/24 124/78  10/09/24 128/74  06/18/24 125/78    Physical Exam  Wt Readings from Last 3 Encounters:  10/31/24 264 lb (119.7 kg)  10/09/24 261 lb (118.4 kg)  06/18/24 262 lb (118.8 kg)    There were no vitals taken for this visit.  Assessment and Plan:  Problem List Items Addressed This Visit   None   No  follow-ups on file.    Leita HILARIO Adie, MD [facility] and [facility] Medicine [place]

## 2024-11-26 NOTE — Telephone Encounter (Signed)
 Copied from CRM #[id number]. Topic: Referral - Status >> Nov 25, 2024  4:27 PM Leonette SQUIBB wrote: Reason for CRM: Patient called saying he was supposed to be referred to Urology for an enlarged prostrate.   He has not heard anything back from the office.

## 2024-12-02 ENCOUNTER — Other Ambulatory Visit: Payer: Self-pay | Admitting: Internal Medicine

## 2024-12-02 DIAGNOSIS — N522 Drug-induced erectile dysfunction: Secondary | ICD-10-CM

## 2024-12-05 NOTE — Telephone Encounter (Signed)
 Requested Prescriptions  Pending Prescriptions Disp Refills   sildenafil  (VIAGRA ) 50 MG tablet [Pharmacy Med Name: Sildenafil  Citrate 50 MG Oral Tablet] 30 tablet 0    Sig: TAKE 1/2 (ONE-HALF) TABLET BY MOUTH ONCE DAILY AS NEEDED FOR ERECTILE DYSFUNCTION     Urology: Erectile Dysfunction Agents Failed - 12/05/2024 12:15 PM      Failed - ALT in normal range and within 360 days    ALT  Date Value Ref Range Status  10/09/2024 55 (H) 0 - 44 IU/L Final         Passed - AST in normal range and within 360 days    AST  Date Value Ref Range Status  10/09/2024 34 0 - 40 IU/L Final         Passed - Last BP in normal range    BP Readings from Last 1 Encounters:  10/31/24 124/78         Passed - Valid encounter within last 12 months    Recent Outpatient Visits           1 month ago Acute non-recurrent maxillary sinusitis   [place] [facility] at [facility], Leita DEL, MD   1 month ago Annual physical exam   [facility] at [facility], Leita DEL, MD   5 months ago Essential (primary) hypertension   [place] [facility] at [facility], Leita DEL, MD   7 months ago Paroxysmal atrial fibrillation [facility])   [place] [facility] at [facility], Leita DEL, MD   10 months ago Viral upper respiratory tract infection   [facility] at [facility], Leita DEL, MD       Future Appointments             In 3 weeks Stoioff, Glendia BROCKS, MD [facility] [place]

## 2024-12-14 ENCOUNTER — Other Ambulatory Visit: Payer: Self-pay | Admitting: Internal Medicine

## 2024-12-14 DIAGNOSIS — I480 Paroxysmal atrial fibrillation: Secondary | ICD-10-CM

## 2024-12-14 DIAGNOSIS — I10 Essential (primary) hypertension: Secondary | ICD-10-CM

## 2024-12-15 ENCOUNTER — Other Ambulatory Visit: Payer: Self-pay | Admitting: Internal Medicine

## 2024-12-15 DIAGNOSIS — I10 Essential (primary) hypertension: Secondary | ICD-10-CM

## 2024-12-16 ENCOUNTER — Encounter: Admitting: Internal Medicine

## 2024-12-16 NOTE — Telephone Encounter (Signed)
 Requested Prescriptions  Pending Prescriptions Disp Refills   losartan  (COZAAR ) 100 MG tablet [Pharmacy Med Name: Losartan  Potassium 100 MG Oral Tablet] 90 tablet 0    Sig: Take 1 tablet by mouth once daily     Cardiovascular:  Angiotensin Receptor Blockers Failed - 12/16/2024  2:31 PM      Failed - Cr in normal range and within 180 days    Creatinine  Date Value Ref Range Status  04/18/2014 1.41 (H) 0.60 - 1.30 mg/dL Final   Creatinine, Ser  Date Value Ref Range Status  10/09/2024 1.60 (H) 0.76 - 1.27 mg/dL Final         Passed - K in normal range and within 180 days    Potassium  Date Value Ref Range Status  10/09/2024 4.6 3.5 - 5.2 mmol/L Final  04/18/2014 3.6 3.5 - 5.1 mmol/L Final         Passed - Patient is not pregnant      Passed - Last BP in normal range    BP Readings from Last 1 Encounters:  10/31/24 124/78         Passed - Valid encounter within last 6 months    Recent Outpatient Visits           1 month ago Acute non-recurrent maxillary sinusitis   [place] [facility] at [facility], Leita DEL, MD   2 months ago Annual physical exam   [facility] at [facility], Leita DEL, MD   6 months ago Essential (primary) hypertension   [place] [facility] at [facility], Leita DEL, MD   7 months ago Paroxysmal atrial fibrillation [facility])   [place] [facility] at [facility], Leita DEL, MD   10 months ago Viral upper respiratory tract infection   [place] [facility] at [facility], Leita DEL, MD       Future Appointments             In 2 weeks Stoioff, Glendia BROCKS, MD [facility]rology [place]

## 2024-12-16 NOTE — Telephone Encounter (Signed)
 Requested Prescriptions  Pending Prescriptions Disp Refills   diltiazem  (CARDIZEM  CD) 120 MG 24 hr capsule [Pharmacy Med Name: dilTIAZem  HCl ER Coated Beads 120 MG Oral Capsule Extended Release 24 Hour] 90 capsule 0    Sig: Take 1 capsule by mouth once daily     Cardiovascular: Calcium Channel Blockers 3 Failed - 12/16/2024 12:43 PM      Failed - ALT in normal range and within 360 days    ALT  Date Value Ref Range Status  10/09/2024 55 (H) 0 - 44 IU/L Final         Failed - Cr in normal range and within 360 days    Creatinine  Date Value Ref Range Status  04/18/2014 1.41 (H) 0.60 - 1.30 mg/dL Final   Creatinine, Ser  Date Value Ref Range Status  10/09/2024 1.60 (H) 0.76 - 1.27 mg/dL Final         Passed - AST in normal range and within 360 days    AST  Date Value Ref Range Status  10/09/2024 34 0 - 40 IU/L Final         Passed - Last BP in normal range    BP Readings from Last 1 Encounters:  10/31/24 124/78         Passed - Last Heart Rate in normal range    Pulse Readings from Last 1 Encounters:  10/31/24 84         Passed - Valid encounter within last 6 months    Recent Outpatient Visits           1 month ago Acute non-recurrent maxillary sinusitis   [place] [facility] at [facility], Leita DEL, MD   2 months ago Annual physical exam   [facility] at [facility], Leita DEL, MD   6 months ago Essential (primary) hypertension   [place] [facility] at [facility], Leita DEL, MD   7 months ago Paroxysmal atrial fibrillation [facility])   [place] [facility] at [facility], Leita DEL, MD   10 months ago Viral upper respiratory tract infection   [place] [facility] at [facility], Leita DEL, MD       Future Appointments             In 2 weeks Stoioff, Glendia BROCKS, MD [facility]  [place]

## 2024-12-30 ENCOUNTER — Encounter: Payer: Self-pay | Admitting: Urology

## 2024-12-30 ENCOUNTER — Ambulatory Visit: Admitting: Urology

## 2025-01-14 DIAGNOSIS — N400 Enlarged prostate without lower urinary tract symptoms: Secondary | ICD-10-CM | POA: Insufficient documentation

## 2025-01-14 NOTE — Assessment & Plan Note (Addendum)
 IPSS 16/5 - nocturia bothersome PVR 2 ml +green tea, OSA on CPAP  Today we reviewed the physiology and common causes of male lower urinary tract symptoms (LUTS). Discussed potential etiologies including infectious, inflammatory, bladder-related, benign prostatic hyperplasia (BPH), and musculoskeletal/pelvic floor contributions. Reviewed the standard diagnostic workup (urinalysis, PVR, uroflow, prostate assessment, possible cystoscopy or imaging) and the spectrum of initial management strategies ranging from behavioral and lifestyle measures to pharmacologic therapy, with procedural options if indicated. All questions were addressed and the patient expressed understanding of the evaluation and treatment pathway.   - Early BPH vs. intrinsic OAB-possibly related to diet, fluids and green tea intake -Will start empiric Flomax  0.4 mg nightly -He may return to clinic in 3-4 months for symptom check.  Although if doing well can move out to 1 year

## 2025-01-14 NOTE — Telephone Encounter (Signed)
 reached out to patient reguarding appt 01/14/25 to be rescheduled due to Cassie being out of clinic, left VM with a return call back number [phone].

## 2025-01-14 NOTE — Progress Notes (Signed)
 "  01/21/25 9:00 AM   Bradley Love Needle 04/24/1966 [id number]   HPI: 59 y.o. 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  Except at 4 AM every morning.  Nocturia is related to bladder urgency and not other nighttime awakenings   PMH: Past Medical History:  Diagnosis Date   Anemia    Arthritis    CHF (congestive heart failure) (HCC)    Heart murmur    Hypertension    Sleep apnea    CPAP    Surgical History: Past Surgical History:  Procedure Laterality Date   CHOLECYSTECTOMY  12/18/2000   CORNEAL TRANSPLANT Right 12/18/2008   EYE SURGERY     LAPAROSCOPIC GASTRIC SLEEVE RESECTION  01/03/2023   SINUS EXPLORATION Bilateral 03/2021   for CSF leak    Family History: Family History  Problem Relation Age of Onset   Diabetes Mother    Cancer Mother    Diabetes Brother    Lung cancer Sister     Social History:  reports that he quit smoking about 29 years ago. His smoking use included cigarettes. He has never used smokeless tobacco. He reports current alcohol use of about 1.0 standard drink of alcohol per week. He reports that he does not use drugs.      Physical Exam: BP (!) 167/128   Pulse 96   Ht 6' 1 (1.854 m)   Wt 262 lb (118.8 kg)   BMI 34.57 kg/m    Constitutional:  Alert and oriented, No acute distress. Cardiovascular: No clubbing, cyanosis, or edema. Respiratory: Normal respiratory effort, no increased work of  breathing. GI: Nondistended Skin: No rashes, bruises or suspicious lesions. Neurologic: Grossly intact, no focal deficits, moving all 4 extremities. Psychiatric: Normal mood and affect.  Laboratory Data: Component Ref Range & Units (hover) 3 mo ago 3 yr ago 4 yr ago 6 yr ago  Prostate Specific Ag, Serum 0.8 0.8 CM 0.8 CM 0.8 CM     Pertinent Imaging: N/A    Assessment & Plan:    Benign prostatic hyperplasia with nocturia Assessment & Plan: IPSS 16/5 - nocturia bothersome PVR 2 ml +green tea, OSA on CPAP  Today we reviewed the physiology and common causes of male lower urinary tract symptoms (LUTS). Discussed potential etiologies including infectious, inflammatory, bladder-related, benign prostatic hyperplasia (BPH), and musculoskeletal/pelvic floor contributions. Reviewed the standard diagnostic workup (urinalysis, PVR, uroflow, prostate assessment, possible cystoscopy or imaging) and the spectrum of initial management strategies ranging from behavioral and lifestyle measures to pharmacologic therapy, with procedural options if indicated. All questions were addressed and the patient expressed understanding of the evaluation and treatment pathway.   - Early BPH vs. intrinsic OAB-possibly related to diet, fluids and green tea intake -Will start empiric Flomax  0.4 mg nightly -He may return to clinic in 3-4 months for symptom check.  Although if doing well can move out to 1 year  Orders: -     Urinalysis, Complete -  Bladder Scan (Post Void Residual) in office -     Tamsulosin  HCl; Take 1 capsule (0.4 mg total) by mouth at bedtime.  Dispense: 90 capsule; Refill: 3  Essential (primary) hypertension Assessment & Plan: HTN > 140 today. Reviewed his blood pressure across two separate readings in clinic.  Recommended that patient secure close follow up with their PCP for further management        Penne Skye, MD 01/21/2025  [facility] Urology [address]  [address] [place], [place] [address] [phone] "

## 2025-01-21 ENCOUNTER — Ambulatory Visit: Admitting: Urology

## 2025-01-21 ENCOUNTER — Ambulatory Visit: Payer: Self-pay

## 2025-01-21 ENCOUNTER — Ambulatory Visit: Admitting: Physician Assistant

## 2025-01-21 ENCOUNTER — Ambulatory Visit: Admitting: Family Medicine

## 2025-01-21 ENCOUNTER — Encounter: Payer: Self-pay | Admitting: Family Medicine

## 2025-01-21 VITALS — BP 167/128 | HR 96 | Ht 73.0 in | Wt 262.0 lb

## 2025-01-21 VITALS — BP 137/89 | HR 92 | Temp 98.1°F | Ht 73.0 in | Wt 262.8 lb

## 2025-01-21 DIAGNOSIS — R7989 Other specified abnormal findings of blood chemistry: Secondary | ICD-10-CM

## 2025-01-21 DIAGNOSIS — I480 Paroxysmal atrial fibrillation: Secondary | ICD-10-CM

## 2025-01-21 DIAGNOSIS — N401 Enlarged prostate with lower urinary tract symptoms: Secondary | ICD-10-CM

## 2025-01-21 DIAGNOSIS — Z903 Acquired absence of stomach [part of]: Secondary | ICD-10-CM

## 2025-01-21 DIAGNOSIS — I10 Essential (primary) hypertension: Secondary | ICD-10-CM | POA: Diagnosis not present

## 2025-01-21 DIAGNOSIS — G4733 Obstructive sleep apnea (adult) (pediatric): Secondary | ICD-10-CM

## 2025-01-21 DIAGNOSIS — D696 Thrombocytopenia, unspecified: Secondary | ICD-10-CM

## 2025-01-21 DIAGNOSIS — R351 Nocturia: Secondary | ICD-10-CM

## 2025-01-21 LAB — MICROSCOPIC EXAMINATION

## 2025-01-21 LAB — URINALYSIS, COMPLETE
Bilirubin, UA: NEGATIVE
Glucose, UA: NEGATIVE
Ketones, UA: NEGATIVE
Leukocytes,UA: NEGATIVE
Nitrite, UA: NEGATIVE
RBC, UA: NEGATIVE
Specific Gravity, UA: 1.03 (ref 1.005–1.030)
Urobilinogen, Ur: 1 mg/dL (ref 0.2–1.0)
pH, UA: 5.5 (ref 5.0–7.5)

## 2025-01-21 LAB — BLADDER SCAN AMB NON-IMAGING: Scan Result: 2

## 2025-01-21 MED ORDER — TAMSULOSIN HCL 0.4 MG PO CAPS: 0.4000 mg | ORAL_CAPSULE | Freq: Every evening | ORAL | 3 refills | Status: AC

## 2025-01-21 MED ORDER — LOSARTAN POTASSIUM-HCTZ 100-12.5 MG PO TABS: 1.0000 | ORAL_TABLET | Freq: Every day | ORAL | 0 refills | Status: AC

## 2025-01-21 NOTE — Assessment & Plan Note (Signed)
 SABRA

## 2025-01-21 NOTE — Telephone Encounter (Signed)
 FYI Only or Action Required?: FYI only for provider: appointment scheduled on 01/21/25.  Patient was last seen in primary care on 10/31/2024 by Justus Leita DEL, MD.  Called Nurse Triage reporting Hypertension.  Symptoms began today.  Interventions attempted: Prescription medications: carvedilol , losartan .  Symptoms are: unchanged.  Triage Disposition: See Physician Within 24 Hours  Patient/caregiver understands and will follow disposition?: Yes   Message from [facility] P sent at 01/21/2025  9:00 AM EST  Reason for Triage: Blood pressure 176/126   Reason for Disposition  Systolic BP >= 180 OR Diastolic >= 110  Answer Assessment - Initial Assessment Questions Scheduled 01/21/25 Advised call 911 if symptoms occur/worsen: severe diff breathing, chest pain > 5 min, faint. Severe HA, changes vision, confusion, diff speech/ walking, numbness/weakness one side of body. Patient verbalized understanding.   1. BLOOD PRESSURE: What is your blood pressure? Did you take at least two measurements 5 minutes apart?     176/126 2. ONSET: When did you take your blood pressure?     today 3. HOW: How did you take your blood pressure? (e.g., automatic home BP monitor, visiting nurse)     Bp cuff at dr appt 4. HISTORY: Do you have a history of high blood pressure?     Htn, chf 5. MEDICINES: Are you taking any medicines for blood pressure? Have you missed any doses recently?     Carvedilol , losartan  last taken this morning 6. OTHER SYMPTOMS: Do you have any symptoms? (e.g., blurred vision, chest pain, difficulty breathing, headache, weakness)  Denies diff breathing, chest pain, HA, changes vision, diff speech/ walking, weakness/numbness one side of body  Protocols used: Blood Pressure - High-A-AH

## 2025-01-21 NOTE — Assessment & Plan Note (Signed)
  Orders:   Comprehensive Metabolic Panel (CMET)

## 2025-01-21 NOTE — Assessment & Plan Note (Signed)
 HTN > 140 today. Reviewed his blood pressure across two separate readings in clinic.  Recommended that patient secure close follow up with their PCP for further management

## 2025-01-21 NOTE — Assessment & Plan Note (Signed)
 Bradley Love

## 2025-01-21 NOTE — Telephone Encounter (Signed)
 Patient disconnected call after warm transfer.  Nurse will attempt to contact patient.

## 2025-01-21 NOTE — Progress Notes (Unsigned)
 "  Acute Office Visit  Subjective:     Patient ID: Bradley Love, male    DOB: August 05, 1966, 59 y.o.   MRN: [id number]  Chief Complaint  Patient presents with   Hypertension    Blood pressure     Discussed the use of AI scribe software for clinical note transcription with the patient, who gave verbal consent to proceed.  History of Present Illness Bradley Love is a 59 year old male with hypertension and atrial fibrillation who presents with elevated blood pressure readings.  During a recent visit to the urologist, his blood pressure was recorded at 170/126 mmHg, significantly higher than his usual readings of around 124/78 mmHg. Today, his blood pressure was recorded as 140/79 mmHg. No dizziness or headaches associated with the elevated blood pressure.  He has a history of atrial fibrillation diagnosed over a year ago and is on Eliquis  5 mg twice daily. No issues with palpitations, syncope, or stroke. He is also on Cardizem  and Losartan  for blood pressure management, and Toprol  XL 200 mg daily. He confirms adherence to his medication regimen without any recent changes.  He underwent gastric bypass surgery on January 03, 2023, at [facility], resulting in significant weight loss from 378 pounds to 262 pounds. He maintains this weight loss and reports no complications from the surgery.  No bleeding or blood in the stool while on blood thinners.     Review of Systems  All other systems reviewed and are negative.       Objective:    BP 137/89   Pulse 92   Temp 98.1 F (36.7 C)   Ht 6' 1 (1.854 m)   Wt 262 lb 12.8 oz (119.2 kg)   SpO2 96%   BMI 34.67 kg/m  BP Readings from Last 3 Encounters:  01/21/25 137/89  01/21/25 (!) 167/128  10/31/24 124/78   Wt Readings from Last 3 Encounters:  01/21/25 262 lb 12.8 oz (119.2 kg)  01/21/25 262 lb (118.8 kg)  10/31/24 264 lb (119.7 kg)      Physical Exam Vitals and nursing note reviewed.  Constitutional:      Appearance:  Normal appearance.  HENT:     Head: Normocephalic.     Right Ear: External ear normal.     Left Ear: External ear normal.  Eyes:     Conjunctiva/sclera: Conjunctivae normal.  Cardiovascular:     Rate and Rhythm: Normal rate.  Pulmonary:     Effort: Pulmonary effort is normal. No respiratory distress.  Abdominal:     Palpations: Abdomen is soft.  Musculoskeletal:        General: Normal range of motion.  Skin:    General: Skin is warm.  Neurological:     Mental Status: He is alert and oriented to person, place, and time.  Psychiatric:        Mood and Affect: Mood normal.     Results for orders placed or performed in visit on 01/21/25  Microscopic Examination   Urine  Result Value Ref Range   WBC, UA 0-5 0 - 5 /hpf   RBC, Urine 0-2 0 - 2 /hpf   Epithelial Cells (non renal) 0-10 0 - 10 /hpf   Casts Present (A) None seen /lpf   Cast Type Granular casts (A) N/A   Bacteria, UA Moderate (A) None seen/Few  Urinalysis, Complete  Result Value Ref Range   Specific Gravity, UA 1.030 1.005 - 1.030   pH, UA 5.5 5.0 - 7.5  Color, UA Yellow Yellow   Appearance Ur Clear Clear   Leukocytes,UA Negative Negative   Protein,UA 2+ (A) Negative/Trace   Glucose, UA Negative Negative   Ketones, UA Negative Negative   RBC, UA Negative Negative   Bilirubin, UA Negative Negative   Urobilinogen, Ur 1.0 0.2 - 1.0 mg/dL   Nitrite, UA Negative Negative   Microscopic Examination See below:   Bladder Scan (Post Void Residual) in office  Result Value Ref Range   Scan Result 2         Assessment & Plan:   Assessment & Plan Paroxysmal atrial fibrillation (HCC)     Essential (primary) hypertension  Orders:   Comprehensive Metabolic Panel (CMET)  Obstructive sleep apnea syndrome     S/P gastric sleeve procedure     Elevated serum creatinine  Orders:   Comprehensive Metabolic Panel (CMET)  Thrombocytopenia, unspecified  Orders:   CBC with Differential   Assessment and  Plan Assessment & Plan Essential hypertension Blood pressure improved from 170/126 mmHg to 140/79 mmHg. No symptoms. Current medications: Cardizem , losartan , Toprol  XL. - Rechecked blood pressure, not at goal <130/80 - Ordered blood work. - Adjust medication doses if necessary based on results. - Start HYZAAR  losartan  100 mg -  hydrochlorothiazide 12.5 mg every day  - Stop Losartan  100 mg every day   Paroxysmal atrial fibrillation Diagnosed over a year ago. Asymptomatic. On Eliquis  5 mg twice daily without side effects. Cardiologist appointment pending. - Reschedule appointment with cardiologist.  Obstructive sleep apnea Compliant with CPAP.   Status post gastric sleeve procedure Underwent procedure in January 2024. Weight loss from 378 lbs to 262 lbs. No weight regain.  History of elevated creatinine. Recheck Sr creatinine.  History of thrombocytopenia. Recheck CBC.    No follow-ups on file.  Simcha Speir K Delynn Olvera, MD  "

## 2025-01-21 NOTE — Telephone Encounter (Signed)
Pt has an appt today.  KP

## 2025-02-18 ENCOUNTER — Ambulatory Visit: Admitting: Family Medicine

## 2025-04-09 ENCOUNTER — Ambulatory Visit: Admitting: Family Medicine

## 2026-01-21 ENCOUNTER — Ambulatory Visit: Admitting: Urology
